# Patient Record
Sex: Male | Born: 1970
Health system: Southern US, Community
[De-identification: ages and names within clinical notes are randomized; demographics above are authoritative.]

## PROBLEM LIST (undated history)

## (undated) DIAGNOSIS — E119 Type 2 diabetes mellitus without complications: Secondary | ICD-10-CM

## (undated) DIAGNOSIS — I10 Essential (primary) hypertension: Secondary | ICD-10-CM

---

## 2017-04-19 ENCOUNTER — Inpatient Hospital Stay (HOSPITAL_COMMUNITY)
Admission: EM | Admit: 2017-04-19 | Discharge: 2017-04-21 | DRG: 638 | Disposition: A | Discharge status: Home / Self Care | Payer: Self-pay | Attending: Internal Medicine | Admitting: Internal Medicine

## 2017-04-19 ENCOUNTER — Encounter (HOSPITAL_COMMUNITY): Payer: Self-pay | Admitting: Emergency Medicine

## 2017-04-19 ENCOUNTER — Emergency Department (HOSPITAL_COMMUNITY): Payer: Self-pay

## 2017-04-19 DIAGNOSIS — R739 Hyperglycemia, unspecified: Secondary | ICD-10-CM

## 2017-04-19 DIAGNOSIS — Z87891 Personal history of nicotine dependence: Secondary | ICD-10-CM

## 2017-04-19 DIAGNOSIS — E785 Hyperlipidemia, unspecified: Secondary | ICD-10-CM | POA: Diagnosis present

## 2017-04-19 DIAGNOSIS — E860 Dehydration: Secondary | ICD-10-CM | POA: Diagnosis present

## 2017-04-19 DIAGNOSIS — Z6838 Body mass index (BMI) 38.0-38.9, adult: Secondary | ICD-10-CM

## 2017-04-19 DIAGNOSIS — E876 Hypokalemia: Secondary | ICD-10-CM | POA: Diagnosis present

## 2017-04-19 DIAGNOSIS — E1100 Type 2 diabetes mellitus with hyperosmolarity without nonketotic hyperglycemic-hyperosmolar coma (NKHHC): Principal | ICD-10-CM | POA: Diagnosis present

## 2017-04-19 DIAGNOSIS — I10 Essential (primary) hypertension: Secondary | ICD-10-CM | POA: Diagnosis present

## 2017-04-19 DIAGNOSIS — Z833 Family history of diabetes mellitus: Secondary | ICD-10-CM

## 2017-04-19 DIAGNOSIS — N179 Acute kidney failure, unspecified: Secondary | ICD-10-CM | POA: Diagnosis present

## 2017-04-19 HISTORY — DX: Essential (primary) hypertension: I10

## 2017-04-19 LAB — URINALYSIS, ROUTINE W REFLEX MICROSCOPIC
BACTERIA UA: NONE SEEN
Bilirubin Urine: NEGATIVE
Hgb urine dipstick: NEGATIVE
KETONES UR: 20 mg/dL — AB
LEUKOCYTES UA: NEGATIVE
Nitrite: NEGATIVE
PH: 6 (ref 5.0–8.0)
PROTEIN: NEGATIVE mg/dL
Specific Gravity, Urine: 1.031 — ABNORMAL HIGH (ref 1.005–1.030)
Squamous Epithelial / LPF: NONE SEEN

## 2017-04-19 LAB — BASIC METABOLIC PANEL
ANION GAP: 11 (ref 5–15)
Anion gap: 10 (ref 5–15)
Anion gap: 8 (ref 5–15)
BUN: 17 mg/dL (ref 6–20)
BUN: 14 mg/dL (ref 6–20)
BUN: 12 mg/dL (ref 6–20)
CALCIUM: 9.9 mg/dL (ref 8.9–10.3)
CHLORIDE: 106 mmol/L (ref 101–111)
CO2: 27 mmol/L (ref 22–32)
CO2: 24 mmol/L (ref 22–32)
CO2: 27 mmol/L (ref 22–32)
Calcium: 9.4 mg/dL (ref 8.9–10.3)
Calcium: 9.5 mg/dL (ref 8.9–10.3)
Chloride: 94 mmol/L — ABNORMAL LOW (ref 101–111)
Chloride: 104 mmol/L (ref 101–111)
Creatinine, Ser: 1.3 mg/dL — ABNORMAL HIGH (ref 0.61–1.24)
Creatinine, Ser: 1.15 mg/dL (ref 0.61–1.24)
Creatinine, Ser: 1.1 mg/dL (ref 0.61–1.24)
GFR calc Af Amer: >60 mL/min (ref >60)
GFR calc Af Amer: >60 mL/min (ref >60)
GLUCOSE: 766 mg/dL — AB (ref 65–99)
Glucose, Bld: 371 mg/dL — ABNORMAL HIGH (ref 65–99)
Glucose, Bld: 190 mg/dL — ABNORMAL HIGH (ref 65–99)
POTASSIUM: 4.1 mmol/L (ref 3.5–5.1)
POTASSIUM: 3.5 mmol/L (ref 3.5–5.1)
Potassium: 4.7 mmol/L (ref 3.5–5.1)
SODIUM: 139 mmol/L (ref 135–145)
SODIUM: 141 mmol/L (ref 135–145)
Sodium: 131 mmol/L — ABNORMAL LOW (ref 135–145)

## 2017-04-19 LAB — GLUCOSE, CAPILLARY
GLUCOSE-CAPILLARY: 325 mg/dL — AB (ref 65–99)
GLUCOSE-CAPILLARY: 312 mg/dL — AB (ref 65–99)
GLUCOSE-CAPILLARY: 253 mg/dL — AB (ref 65–99)
GLUCOSE-CAPILLARY: 204 mg/dL — AB (ref 65–99)
Glucose-Capillary: 383 mg/dL — ABNORMAL HIGH (ref 65–99)
Glucose-Capillary: 188 mg/dL — ABNORMAL HIGH (ref 65–99)
Glucose-Capillary: 170 mg/dL — ABNORMAL HIGH (ref 65–99)

## 2017-04-19 LAB — CBG MONITORING, ED: Glucose-Capillary: 478 mg/dL — ABNORMAL HIGH (ref 65–99)

## 2017-04-19 LAB — CBC
HCT: 44 % (ref 39.0–52.0)
HEMOGLOBIN: 15.4 g/dL (ref 13.0–17.0)
MCH: 32 pg (ref 26.0–34.0)
MCHC: 35 g/dL (ref 30.0–36.0)
MCV: 91.3 fL (ref 78.0–100.0)
Platelets: 255 10*3/uL (ref 150–400)
RBC: 4.82 MIL/uL (ref 4.22–5.81)
RDW: 12.4 % (ref 11.5–15.5)
WBC: 8.8 10*3/uL (ref 4.0–10.5)

## 2017-04-19 LAB — BLOOD GAS, VENOUS
Acid-Base Excess: 0.5 mmol/L (ref 0.0–2.0)
Bicarbonate: 25 mmol/L (ref 20.0–28.0)
O2 Saturation: 80.5 %
PCO2 VEN: 41.8 mmHg — AB (ref 44.0–60.0)
PH VEN: 7.394 (ref 7.250–7.430)
Patient temperature: 98.6
pO2, Ven: 46.1 mmHg — ABNORMAL HIGH (ref 32.0–45.0)

## 2017-04-19 LAB — I-STAT TROPONIN, ED: TROPONIN I, POC: 0 ng/mL (ref 0.00–0.08)

## 2017-04-19 LAB — MRSA PCR SCREENING: MRSA BY PCR: NEGATIVE

## 2017-04-19 LAB — OSMOLALITY: Osmolality: 316 mOsm/kg — ABNORMAL HIGH (ref 275–295)

## 2017-04-19 MED ORDER — DEXTROSE-NACL 5-0.45 % IV SOLN
INTRAVENOUS | Status: DC
  Administered 2017-04-19 – 2017-04-20 (×2): via INTRAVENOUS

## 2017-04-19 MED ORDER — SODIUM CHLORIDE 0.9 % IV SOLN
INTRAVENOUS | Status: DC
  Administered 2017-04-20: 13:00:00 via INTRAVENOUS

## 2017-04-19 MED ORDER — INSULIN REGULAR BOLUS VIA INFUSION
0.0000 [IU] | Freq: Three times a day (TID) | INTRAVENOUS | Status: DC
  Administered 2017-04-19: 10 [IU] via INTRAVENOUS
  Filled 2017-04-19: qty 10

## 2017-04-19 MED ORDER — SODIUM CHLORIDE 0.9 % IV SOLN
INTRAVENOUS | Status: DC
  Filled 2017-04-19 (×2): qty 1

## 2017-04-19 MED ORDER — ENOXAPARIN SODIUM 40 MG/0.4ML ~~LOC~~ SOLN
40.0000 mg | SUBCUTANEOUS | Status: DC
  Administered 2017-04-19: 40 mg via SUBCUTANEOUS
  Filled 2017-04-19 (×2): qty 0.4

## 2017-04-19 MED ORDER — ONDANSETRON HCL 4 MG PO TABS: 4.0000 mg | ORAL_TABLET | Freq: Four times a day (QID) | ORAL | Status: DC | PRN

## 2017-04-19 MED ORDER — SODIUM CHLORIDE 0.9 % IV SOLN
INTRAVENOUS | Status: DC
  Administered 2017-04-19: 15:00:00 via INTRAVENOUS

## 2017-04-19 MED ORDER — POTASSIUM CHLORIDE 10 MEQ/100ML IV SOLN
10.0000 meq | INTRAVENOUS | Status: DC
  Administered 2017-04-19: 10 meq via INTRAVENOUS
  Filled 2017-04-19: qty 100

## 2017-04-19 MED ORDER — HYDRALAZINE HCL 20 MG/ML IJ SOLN
10.0000 mg | Freq: Four times a day (QID) | INTRAMUSCULAR | Status: DC | PRN
  Administered 2017-04-19: 10 mg via INTRAVENOUS
  Filled 2017-04-19: qty 1

## 2017-04-19 MED ORDER — ACETAMINOPHEN 650 MG RE SUPP: 650.0000 mg | Freq: Four times a day (QID) | RECTAL | Status: DC | PRN

## 2017-04-19 MED ORDER — ONDANSETRON HCL 4 MG/2ML IJ SOLN: 4.0000 mg | Freq: Four times a day (QID) | INTRAMUSCULAR | Status: DC | PRN

## 2017-04-19 MED ORDER — SODIUM CHLORIDE 0.9 % IV SOLN
INTRAVENOUS | Status: DC
  Administered 2017-04-19: 4.2 [IU]/h via INTRAVENOUS
  Filled 2017-04-19: qty 1

## 2017-04-19 MED ORDER — SENNOSIDES-DOCUSATE SODIUM 8.6-50 MG PO TABS: 1.0000 | ORAL_TABLET | Freq: Every evening | ORAL | Status: DC | PRN

## 2017-04-19 MED ORDER — ACETAMINOPHEN 325 MG PO TABS: 650.0000 mg | ORAL_TABLET | Freq: Four times a day (QID) | ORAL | Status: DC | PRN

## 2017-04-19 MED ORDER — SODIUM CHLORIDE 0.9% FLUSH
3.0000 mL | Freq: Two times a day (BID) | INTRAVENOUS | Status: DC
  Administered 2017-04-19 – 2017-04-21 (×3): 3 mL via INTRAVENOUS

## 2017-04-19 MED ORDER — ALBUTEROL SULFATE (2.5 MG/3ML) 0.083% IN NEBU: 2.5000 mg | INHALATION_SOLUTION | RESPIRATORY_TRACT | Status: DC | PRN

## 2017-04-19 MED ORDER — AMLODIPINE BESYLATE 5 MG PO TABS
5.0000 mg | ORAL_TABLET | Freq: Every day | ORAL | Status: DC
  Administered 2017-04-19 – 2017-04-20 (×2): 5 mg via ORAL
  Filled 2017-04-19 (×2): qty 1

## 2017-04-19 MED ORDER — DEXTROSE 50 % IV SOLN: 25.0000 mL | INTRAVENOUS | Status: DC | PRN

## 2017-04-19 MED ORDER — DEXTROSE-NACL 5-0.45 % IV SOLN: INTRAVENOUS | Status: DC

## 2017-04-19 NOTE — H&P (Signed)
HISTORY AND PHYSICAL       PATIENT DETAILS Name: Albert Miller Age: 46 y.o. Sex: male Date of Birth: 1971/04/01 Admit Date: 04/19/2017 WUJ:[id number], No Pcp Per   Patient coming from: Home   CHIEF COMPLAINT:  Polyuria, polydipsia, blurry vision, dizziness for the past 3 days  HPI: Albert Miller is a 46 y.o. male with no prior significant medical history presented to the ED for evaluation of the above-noted complaints. For the past few weeks, patient has just not felt right and not his usual self. 3 days ago he started noticing significant amount of polyuria, polydipsia and blurry vision. He also started getting slightly dizzy when he ever he ambulated. He subsequently presented to the ED today, and was found to have blood sugars more than 700 and he was given IV fluids, started on IV insulin and the hospitalist service was then asked to admit this patient for further evaluation and treatment.  During my evaluation patient denied any fever, headache, chest pain, nausea, vomiting, diarrhea.  Apparently he was diagnosed with blood pressure many years ago-but he lost weight and was subsequently taken off his antihypertensives. He currently is not on any medications   ED Course:  See above  Note: Lives at: Home Mobility:  Independent Chronic Indwelling Foley:no   REVIEW OF SYSTEMS:  Constitutional:   No  weight loss, night sweats,  Fevers, chills, fatigue.  HEENT:    No headaches, Dysphagia,Tooth/dental problems,Sore throat  Cardio-vascular: No chest pain,Orthopnea, PND,lower extremity edema, anasarca, palpitations  GI:  No heartburn, indigestion, abdominal pain, nausea, vomiting, diarrhea, melena or hematochezia  Resp: No shortness of breath, cough, hemoptysis,plueritic chest pain.   Skin:  No rash or lesions.  GU:  No dysuria, change in color of urine, no urgency or frequency.  No flank pain.  Musculoskeletal: No joint pain or swelling.  No  decreased range of motion.  No back pain.  Endocrine: No heat intolerance, no cold intolerance  Psych: No change in mood or affect. No depression or anxiety.  No memory loss.   ALLERGIES:  No Known Allergies  PAST MEDICAL HISTORY: Past Medical History:  Diagnosis Date  . Hypertension     PAST SURGICAL HISTORY: History reviewed. No pertinent surgical history.  MEDICATIONS AT HOME: Prior to Admission medications   Not on File    FAMILY HISTORY: Father-diabetes mellitus   SOCIAL HISTORY:  reports that he has quit smoking. He has never used smokeless tobacco. He reports that he does not drink alcohol. His drug history is not on file.  PHYSICAL EXAM: Blood pressure (!) 180/114, pulse 99, temperature 98.2 F (36.8 C), temperature source Oral, resp. rate 16, SpO2 100 %.  General appearance :Awake, alert, not in any distress. Speech Clear. Not toxic Looking Eyes:, pupils equally reactive to light and accomodation,no scleral icterus.Pink conjunctiva HEENT: Atraumatic and Normocephalic Neck: supple, no JVD. No cervical lymphadenopathy. No thyromegaly Resp:Good air entry bilaterally, no added sounds  CVS: S1 S2 regular, no murmurs.  GI: Bowel sounds present, Non tender and not distended with no gaurding, rigidity or rebound.No organomegaly Extremities: B/L Lower Ext shows no edema, both legs are warm to touch Neurology:  speech clear,Non focal, sensation is grossly intact. Psychiatric: Normal judgment and insight. Alert and oriented x 3. Normal mood. Musculoskeletal:gait appears to be normal.No digital cyanosis Skin:No Rash, warm and dry Wounds:N/A  LABS ON ADMISSION:  I have personally reviewed following labs and imaging studies  CBC:  Recent Labs  Lab 04/19/17 1249  WBC 8.8  HGB 15.4  HCT 44.0  MCV 91.3  PLT 255    Basic Metabolic Panel:  Recent Labs Lab 04/19/17 1249  NA 131*  K 4.7  CL 94*  CO2 27  GLUCOSE 766*  BUN 17  CREATININE 1.30*  CALCIUM  9.9    GFR: CrCl cannot be calculated (Unknown ideal weight.).  Liver Function Tests: No results for input(s): AST, ALT, ALKPHOS, BILITOT, PROT, ALBUMIN in the last 168 hours. No results for input(s): LIPASE, AMYLASE in the last 168 hours. No results for input(s): AMMONIA in the last 168 hours.  Coagulation Profile: No results for input(s): INR, PROTIME in the last 168 hours.  Cardiac Enzymes: No results for input(s): CKTOTAL, CKMB, CKMBINDEX, TROPONINI in the last 168 hours.  BNP (last 3 results) No results for input(s): PROBNP in the last 8760 hours.  HbA1C: No results for input(s): HGBA1C in the last 72 hours.  CBG: No results for input(s): GLUCAP in the last 168 hours.  Lipid Profile: No results for input(s): CHOL, HDL, LDLCALC, TRIG, CHOLHDL, LDLDIRECT in the last 72 hours.  Thyroid Function Tests: No results for input(s): TSH, T4TOTAL, FREET4, T3FREE, THYROIDAB in the last 72 hours.  Anemia Panel: No results for input(s): VITAMINB12, FOLATE, FERRITIN, TIBC, IRON, RETICCTPCT in the last 72 hours.  Urine analysis:    Component Value Date/Time   COLORURINE STRAW (A) 04/19/2017 1354   APPEARANCEUR CLEAR 04/19/2017 1354   LABSPEC 1.031 (H) 04/19/2017 1354   PHURINE 6.0 04/19/2017 1354   GLUCOSEU >=500 (A) 04/19/2017 1354   HGBUR NEGATIVE 04/19/2017 1354   BILIRUBINUR NEGATIVE 04/19/2017 1354   KETONESUR 20 (A) 04/19/2017 1354   PROTEINUR NEGATIVE 04/19/2017 1354   NITRITE NEGATIVE 04/19/2017 1354   LEUKOCYTESUR NEGATIVE 04/19/2017 1354    Sepsis Labs: Lactic Acid, Venous No results found for: LATICACIDVEN   Microbiology: No results found for this or any previous visit (from the past 240 hour(s)).    RADIOLOGIC STUDIES ON ADMISSION: Dg Chest 2 View  Result Date: 04/19/2017 CLINICAL DATA:  Mid chest pain and dizziness for 3 days. Visual changes and polyuria. EXAM: CHEST  2 VIEW COMPARISON:  None. FINDINGS: Normal heart size. Lungs clear. No pneumothorax.  No pleural effusion. IMPRESSION: No active cardiopulmonary disease. Electronically Signed   By: Jolaine ClickArthur  Hoss M.D.   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 Given that he is symptomatic, he will likely require insulin on discharge. Await A1c. Begin diabetic education, consultation nutrition and diabetic coordinator.  Hypertension: Blood pressure currently uncontrolled-start low-dose amlodipine and follow.  Further plan will depend as patient's clinical course evolves and further radiologic and laboratory data become available. Patient will be monitored closely.  Above noted plan was discussed with patient/girlfriend face to face at bedside, they were in agreement.   CONSULTS: None  DVT Prophylaxis: Prophylactic Lovenox  Code Status: Full Code  Disposition Plan:  Discharge back home  possibly in 1-2 days, depending on clinical course  Admission status:  Inpatient  going to  SDU  Total time spent  55 minutes.Greater than 50% of this time was spent in counseling, explanation of diagnosis, planning of further management, and coordination of care.  Brewing technologisthanker [place] [facility] Pager [phone]  If 7PM-7AM, please contact night-coverage [email or web address] Password [email or web address] 04/19/2017, 3:31 PM

## 2017-04-19 NOTE — ED Provider Notes (Signed)
[facility]-EMERGENCY DEPT Provider Note   CSN: [id number] Arrival date & time: 04/19/17  1213     History   Chief Complaint Chief Complaint  Patient presents with  . Chest Pain    HPI Albert Miller is a 46 y.o. male with a past medical history of hypertension presenting with sudden onset of polydipsia and polyuria 3-4 days ago, dizziness and intermittent heartburn for 2 days. No known history of diabetes. Denies abdominal pain, nausea, vomiting, diarrhea, chest pressure, shortness of breath, fever, chills.  HPI  Past Medical History:  Diagnosis Date  . Hypertension     There are no active problems to display for this patient.   History reviewed. No pertinent surgical history.     Home Medications    Prior to Admission medications   Not on File    Family History History reviewed. No pertinent family history.  Social History Social History  Substance Use Topics  . Smoking status: Former Games developer  . Smokeless tobacco: Never Used  . Alcohol use No     Allergies   Patient has no known allergies.   Review of Systems Review of Systems  Constitutional: Negative for appetite change, chills and fever.  HENT: Negative for ear pain and sore throat.        Patient complaining of dry mouth  Eyes: Negative for pain and visual disturbance.  Respiratory: Negative for cough, chest tightness, shortness of breath, wheezing and stridor.   Cardiovascular: Negative for chest pain, palpitations and leg swelling.       Patient reports that he's been having a heartburn sensation on and off over the last 2 days but no chest pressure or pain  Gastrointestinal: Negative for abdominal distention, abdominal pain, diarrhea, nausea and vomiting.       He did experience some nausea 3 days ago but not at this time or since then  Endocrine: Positive for polydipsia and polyuria.  Genitourinary: Negative for dysuria and hematuria.  Musculoskeletal: Negative for arthralgias, back pain, neck  pain and neck stiffness.  Skin: Negative for color change, pallor and rash.  Neurological: Negative for seizures and syncope.     Physical Exam Updated Vital Signs BP (!) 180/114 (BP Location: Left Arm)   Pulse 99   Temp 98.2 F (36.8 C) (Oral)   Resp 16   SpO2 100%   Physical Exam  Constitutional: He is oriented to person, place, and time. He appears well-developed and well-nourished. No distress.  Patient is afebrile, nontoxic-appearing, sitting comfortably in bed in no acute distress.  HENT:  Head: Normocephalic and atraumatic.  Eyes: Conjunctivae and EOM are normal. Right eye exhibits no discharge. Left eye exhibits no discharge.  Neck: Neck supple.  Cardiovascular: Normal rate, regular rhythm and normal heart sounds.   No murmur heard. Pulmonary/Chest: Effort normal and breath sounds normal. No respiratory distress. He has no wheezes. He has no rales. He exhibits no tenderness.  Abdominal: Soft. He exhibits no distension. There is no tenderness. There is no guarding.  Musculoskeletal: He exhibits no edema or deformity.  Neurological: He is alert and oriented to person, place, and time.  Skin: Skin is warm and dry. No rash noted. He is not diaphoretic. No erythema. No pallor.  Psychiatric: He has a normal mood and affect.  Nursing note and vitals reviewed.    ED Treatments / Results  Labs (all labs ordered are listed, but only abnormal results are displayed) Labs Reviewed  BASIC METABOLIC PANEL - Abnormal; Notable for the following:  Result Value   Sodium 131 (*)    Chloride 94 (*)    Glucose, Bld 766 (*)    Creatinine, Ser 1.30 (*)    All other components within normal limits  URINALYSIS, ROUTINE W REFLEX MICROSCOPIC - Abnormal; Notable for the following:    Color, Urine STRAW (*)    Specific Gravity, Urine 1.031 (*)    Glucose, UA >=500 (*)    Ketones, ur 20 (*)    All other components within normal limits  CBC  OSMOLALITY  I-STAT TROPOININ, ED  I-STAT  VENOUS BLOOD GAS, ED    EKG  EKG Interpretation  Date/Time:  Saturday Apr 19 2017 12:23:07 EDT Ventricular Rate:  97 PR Interval:    QRS Duration: 80 QT Interval:  326 QTC Calculation: 415 R Axis:   48 Text Interpretation:  Sinus rhythm Probable anteroseptal infarct, old Borderline T abnormalities, inferior leads T wave inversion in III with flattening  in V3-V6, no previous tracing available Confirmed by Frederick PeersLittle, Rachel [phone]([id number]) on 04/19/2017 12:26:29 PM Also confirmed by Frederick PeersLittle, Rachel [phone]), editor Misty StanleyScales-Price, Shannon [phone]([id number])  on 04/19/2017 12:36:32 PM       Radiology Dg Chest 2 View  Result Date: 04/19/2017 CLINICAL DATA:  Mid chest pain and dizziness for 3 days. Visual changes and polyuria. EXAM: CHEST  2 VIEW COMPARISON:  None. FINDINGS: Normal heart size. Lungs clear. No pneumothorax. No pleural effusion. IMPRESSION: No active cardiopulmonary disease. Electronically Signed   By: Jolaine ClickArthur  Hoss M.D.   On: 04/19/2017 12:50    Procedures Procedures (including critical care time)  Medications Ordered in ED Medications  0.9 %  sodium chloride infusion ( Intravenous New Bag/Given 04/19/17 1459)  0.9 %  sodium chloride infusion ( Intravenous New Bag/Given 04/19/17 1459)  dextrose 5 %-0.45 % sodium chloride infusion (not administered)  insulin regular (NOVOLIN R,HUMULIN R) 100 Units in sodium chloride 0.9 % 100 mL (1 Units/mL) infusion (not administered)  potassium chloride 10 mEq in 100 mL IVPB (not administered)     Initial Impression / Assessment and Plan / ED Course  I have reviewed the triage vital signs and the nursing notes.  Pertinent labs & imaging results that were available during my care of the patient were reviewed by me and considered in my medical decision making (see chart for details).  Clinical Course as of Apr 19 1504  Sat Apr 19, 2017  1412 Creatinine: (!) 1.30 [JM]    Clinical Course User Index [JM] Georgiana ShoreMitchell, Sanjay Broadfoot B, [place]PA-C   Patient presents with  hyperosmolar hyperglycemic state without known history of diabetes. Pain symptoms of polyuria and polydipsia blood glucose 766. No anion gap normal pH. Started IV fluids and insulin drip. Patient otherwise stable, well-appearing. Consulted for admission  Spoke to admitting physician, Dr. Jerral RalphGhimire, who will come to see patient and admit.   Final Clinical Impressions(s) / ED Diagnoses   Final diagnoses:  Hyperglycemia    New Prescriptions New Prescriptions   No medications on file     Gregary CromerMitchell, Ayvah Caroll B, PA-C 04/19/17 1701    Gerhard MunchLockwood, Robert, MD 04/20/17 1550

## 2017-04-19 NOTE — ED Triage Notes (Signed)
Pt reports mid CP, dizziness (feels like he is walking to one side), polyuria, and intermittent vision changes for the past 3 days. Pt alert and oriented. No extremity weakness or facial droop.

## 2017-04-20 DIAGNOSIS — E1100 Type 2 diabetes mellitus with hyperosmolarity without nonketotic hyperglycemic-hyperosmolar coma (NKHHC): Principal | ICD-10-CM

## 2017-04-20 LAB — HIV ANTIBODY (ROUTINE TESTING W REFLEX): HIV Screen 4th Generation wRfx: NONREACTIVE

## 2017-04-20 LAB — BASIC METABOLIC PANEL
ANION GAP: 9 (ref 5–15)
Anion gap: 8 (ref 5–15)
BUN: 15 mg/dL (ref 6–20)
BUN: 14 mg/dL (ref 6–20)
CALCIUM: 9.1 mg/dL (ref 8.9–10.3)
CHLORIDE: 104 mmol/L (ref 101–111)
CO2: 29 mmol/L (ref 22–32)
CO2: 28 mmol/L (ref 22–32)
CREATININE: 1.3 mg/dL — AB (ref 0.61–1.24)
CREATININE: 1.15 mg/dL (ref 0.61–1.24)
Calcium: 9.1 mg/dL (ref 8.9–10.3)
Chloride: 103 mmol/L (ref 101–111)
GFR calc Af Amer: >60 mL/min (ref >60)
GFR calc Af Amer: >60 mL/min (ref >60)
GFR calc non Af Amer: >60 mL/min (ref >60)
GFR calc non Af Amer: >60 mL/min (ref >60)
GLUCOSE: 155 mg/dL — AB (ref 65–99)
GLUCOSE: 135 mg/dL — AB (ref 65–99)
Potassium: 3.4 mmol/L — ABNORMAL LOW (ref 3.5–5.1)
Potassium: 3.5 mmol/L (ref 3.5–5.1)
SODIUM: 141 mmol/L (ref 135–145)
SODIUM: 140 mmol/L (ref 135–145)

## 2017-04-20 LAB — GLUCOSE, CAPILLARY
GLUCOSE-CAPILLARY: 133 mg/dL — AB (ref 65–99)
GLUCOSE-CAPILLARY: 140 mg/dL — AB (ref 65–99)
GLUCOSE-CAPILLARY: 143 mg/dL — AB (ref 65–99)
GLUCOSE-CAPILLARY: 146 mg/dL — AB (ref 65–99)
GLUCOSE-CAPILLARY: 172 mg/dL — AB (ref 65–99)
GLUCOSE-CAPILLARY: 256 mg/dL — AB (ref 65–99)
GLUCOSE-CAPILLARY: 342 mg/dL — AB (ref 65–99)
Glucose-Capillary: 163 mg/dL — ABNORMAL HIGH (ref 65–99)
Glucose-Capillary: 172 mg/dL — ABNORMAL HIGH (ref 65–99)
Glucose-Capillary: 260 mg/dL — ABNORMAL HIGH (ref 65–99)

## 2017-04-20 LAB — CBC
HCT: 40.3 % (ref 39.0–52.0)
HEMOGLOBIN: 13.8 g/dL (ref 13.0–17.0)
MCH: 30.8 pg (ref 26.0–34.0)
MCHC: 34.2 g/dL (ref 30.0–36.0)
MCV: 90 fL (ref 78.0–100.0)
PLATELETS: 237 10*3/uL (ref 150–400)
RBC: 4.48 MIL/uL (ref 4.22–5.81)
RDW: 12.4 % (ref 11.5–15.5)
WBC: 7 10*3/uL (ref 4.0–10.5)

## 2017-04-20 LAB — LIPID PANEL
CHOLESTEROL: 179 mg/dL (ref 0–200)
HDL: 39 mg/dL — ABNORMAL LOW (ref >40)
LDL Cholesterol: 103 mg/dL — ABNORMAL HIGH (ref 0–99)
TRIGLYCERIDES: 187 mg/dL — AB (ref <150)
Total CHOL/HDL Ratio: 4.6 RATIO
VLDL: 37 mg/dL (ref 0–40)

## 2017-04-20 MED ORDER — PHENYLEPHRINE IN HARD FAT 0.25 % RE SUPP: 1.0000 | Freq: Two times a day (BID) | RECTAL | Status: DC

## 2017-04-20 MED ORDER — INSULIN ASPART 100 UNIT/ML ~~LOC~~ SOLN
0.0000 [IU] | Freq: Three times a day (TID) | SUBCUTANEOUS | Status: DC
  Administered 2017-04-20: 5 [IU] via SUBCUTANEOUS
  Administered 2017-04-20: 7 [IU] via SUBCUTANEOUS
  Administered 2017-04-20: 2 [IU] via SUBCUTANEOUS
  Administered 2017-04-21: 5 [IU] via SUBCUTANEOUS
  Administered 2017-04-21: 3 [IU] via SUBCUTANEOUS

## 2017-04-20 MED ORDER — INSULIN ASPART 100 UNIT/ML ~~LOC~~ SOLN
0.0000 [IU] | Freq: Every day | SUBCUTANEOUS | Status: DC
  Administered 2017-04-20: 3 [IU] via SUBCUTANEOUS

## 2017-04-20 MED ORDER — INSULIN DETEMIR 100 UNIT/ML ~~LOC~~ SOLN
10.0000 [IU] | Freq: Every day | SUBCUTANEOUS | Status: DC
  Administered 2017-04-20 – 2017-04-21 (×2): 10 [IU] via SUBCUTANEOUS
  Filled 2017-04-20 (×2): qty 0.1

## 2017-04-20 MED ORDER — POTASSIUM CHLORIDE CRYS ER 20 MEQ PO TBCR
20.0000 meq | EXTENDED_RELEASE_TABLET | Freq: Once | ORAL | Status: AC
  Administered 2017-04-20: 20 meq via ORAL
  Filled 2017-04-20: qty 1

## 2017-04-20 MED ORDER — HYDROCORTISONE 2.5 % RE CREA
TOPICAL_CREAM | RECTAL | Status: DC | PRN
  Filled 2017-04-20: qty 28.35

## 2017-04-20 MED ORDER — PHENYLEPHRINE IN HARD FAT 0.25 % RE SUPP
1.0000 | Freq: Every day | RECTAL | Status: DC | PRN
  Filled 2017-04-20: qty 1

## 2017-04-20 MED ORDER — SENNOSIDES-DOCUSATE SODIUM 8.6-50 MG PO TABS
1.0000 | ORAL_TABLET | Freq: Two times a day (BID) | ORAL | Status: DC
  Administered 2017-04-20 – 2017-04-21 (×2): 1 via ORAL
  Filled 2017-04-20 (×4): qty 1

## 2017-04-20 MED ORDER — LIVING WELL WITH DIABETES BOOK
Freq: Once | Status: AC
  Administered 2017-04-20: 17:00:00
  Filled 2017-04-20: qty 1

## 2017-04-20 NOTE — Progress Notes (Signed)
Patient watched the diabetic teaching video and got the book,

## 2017-04-20 NOTE — Progress Notes (Signed)
Inpatient Diabetes Program Recommendations  AACE/ADA: New Consensus Statement on Inpatient Glycemic Control (2015)  Target Ranges:  Prepandial:   less than 140 mg/dL      Peak postprandial:   less than 180 mg/dL (1-2 hours)      Critically ill patients:  140 - 180 mg/dL   Lab Results  Component Value Date   GLUCAP 256 (H) 04/20/2017    Review of Glycemic Control  Diabetes history: Newly-diagnosed Outpatient Diabetes medications: None Current orders for Inpatient glycemic control: Levemir 10 units QAM, Novolog 0-9 units tidwc and hs  HgbA1C pending. Will need affordable insulin.  Inpatient Diabetes Program Recommendations:   Begin 70/30 15 units bid. D/C Levemir. Increase Novolog to 0-15 units tidwc and hs  Needs case manager consult for assistance obtaining PCP. Will order insulin starter kit and RN to begin assisting pt to self-inject. Will need glucose meter and supplies prescription.  Diabetes Coordinator to see in am.  Thank you. Lorenda Peck, RD, LDN, CDE Inpatient Diabetes Coordinator [phone]

## 2017-04-20 NOTE — Progress Notes (Signed)
PROGRESS NOTE  Albert Miller ZOX:[id number] DOB: 1971/09/18 DOA: 04/19/2017 PCP: Patient, No Pcp Per  HPI/Recap of past 24 hours:  Feeling better, off insulin drip, mother and significant other at bedside  Assessment/Plan: Principal Problem:   Uncontrolled type 2 DM with hyperosmolar nonketotic hyperglycemia (HCC) Active Problems:   HTN (hypertension)  Uncontrolled type II DM with hyperosmolar nonketotic hyperglycemia:  New diagnosis, presented with blood sugar 766, he is started on insulin drip, ivf, admitted to stepdown.  Now off insulin drip, transfer to med tele A1c pending, consultation nutrition and diabetic coordinator.case manager consulted, patient does not have pmd, no insurance.  AKI:  Likely from dehydration ua is concentrated, no infection, does has glucose,  Cr 1.3 on admission, cr improved at 1.15 after hydration  Hypokalemia: replace k, check mag  Hypertension: Blood pressure currently uncontrolled-start low-dose amlodipine and follow.  Body mass index is 37.97 kg/m.   Further plan will depend as patient's clinical course evolves and further radiologic and laboratory data become available. Patient will be monitored closely.  Above noted plan was discussed with patient/girlfriend face to face at bedside, they were in agreement.   CONSULTS: None  DVT Prophylaxis: Prophylactic Lovenox  Code Status: Full Code  Disposition Plan:  Discharge back home  , likely on 5/21   Procedures:  none  Antibiotics:  none   Objective: BP 123/81   Pulse 68   Temp 98.3 F (36.8 C) (Oral)   Resp 18   Ht 6' (1.829 m)   Wt 129 kg (284 lb 6.3 oz)   SpO2 97%   BMI 38.57 kg/m   Intake/Output Summary (Last 24 hours) at 04/20/17 0752 Last data filed at 04/20/17 0600  Gross per 24 hour  Intake          2792.95 ml  Output              300 ml  Net          2492.95 ml   Filed Weights   04/19/17 1650 04/20/17 0501  Weight: 127 kg (279 lb 15.8  oz) 129 kg (284 lb 6.3 oz)    Exam:   General:  NAD  Cardiovascular: RRR  Respiratory: CTABL  Abdomen: Soft/ND/NT, positive BS  Musculoskeletal: No Edema  Neuro: aaox3  Data Reviewed: Basic Metabolic Panel:  Recent Labs Lab 04/19/17 1249 04/19/17 1739 04/19/17 2134 04/20/17 0002 04/20/17 0325  NA 131* 139 141 141 140  K 4.7 4.1 3.5 3.4* 3.5  CL 94* 104 106 104 103  CO2 27 24 27 29 28   GLUCOSE 766* 371* 190* 155* 135*  BUN 17 14 12 14 15   CREATININE 1.30* 1.15 1.10 1.30* 1.15  CALCIUM 9.9 9.4 9.5 9.1 9.1   Liver Function Tests: No results for input(s): AST, ALT, ALKPHOS, BILITOT, PROT, ALBUMIN in the last 168 hours. No results for input(s): LIPASE, AMYLASE in the last 168 hours. No results for input(s): AMMONIA in the last 168 hours. CBC:  Recent Labs Lab 04/19/17 1249 04/20/17 0325  WBC 8.8 7.0  HGB 15.4 13.8  HCT 44.0 40.3  MCV 91.3 90.0  PLT 255 237   Cardiac Enzymes:   No results for input(s): CKTOTAL, CKMB, CKMBINDEX, TROPONINI in the last 168 hours. BNP (last 3 results) No results for input(s): BNP in the last 8760 hours.  ProBNP (last 3 results) No results for input(s): PROBNP in the last 8760 hours.  CBG:  Recent Labs Lab 04/20/17 0227 04/20/17 8119 04/20/17 0453 04/20/17 1478  04/20/17 0745  GLUCAP 140* 133* 146* 163* 172*    Recent Results (from the past 240 hour(s))  MRSA PCR Screening     Status: None   Collection Time: 04/19/17  4:45 PM  Result Value Ref Range Status   MRSA by PCR NEGATIVE NEGATIVE Final    Comment:        The GeneXpert MRSA Assay (FDA approved for NASAL specimens only), is one component of a comprehensive MRSA colonization surveillance program. It is not intended to diagnose MRSA infection nor to guide or monitor treatment for MRSA infections.      Studies: Dg Chest 2 View  Result Date: 04/19/2017 CLINICAL DATA:  Mid chest pain and dizziness for 3 days. Visual changes and polyuria. EXAM: CHEST   2 VIEW COMPARISON:  None. FINDINGS: Normal heart size. Lungs clear. No pneumothorax. No pleural effusion. IMPRESSION: No active cardiopulmonary disease. Electronically Signed   By: Jolaine ClickArthur  Hoss M.D.   On: 04/19/2017 12:50    Scheduled Meds: . amLODipine  5 mg Oral Daily  . enoxaparin (LOVENOX) injection  40 mg Subcutaneous Q24H  . insulin aspart  0-5 Units Subcutaneous QHS  . insulin aspart  0-9 Units Subcutaneous TID WC  . insulin detemir  10 Units Subcutaneous Daily  . sodium chloride flush  3 mL Intravenous Q12H    Continuous Infusions: . sodium chloride 125 mL/hr at 04/19/17 1630  . dextrose 5 % and 0.45% NaCl 100 mL/hr at 04/20/17 [id number]     Time spent: 25mins  Eliot Popper MD, PhD  [facility] Pager [phone]. If 7PM-7AM, please contact night-coverage at [email or web address], password [email or web address] 04/20/2017, 7:52 AM  LOS: 1 day

## 2017-04-21 LAB — COMPREHENSIVE METABOLIC PANEL
ALT: 63 U/L (ref 17–63)
ANION GAP: 7 (ref 5–15)
AST: 39 U/L (ref 15–41)
Albumin: 3.4 g/dL — ABNORMAL LOW (ref 3.5–5.0)
Alkaline Phosphatase: 91 U/L (ref 38–126)
BUN: 14 mg/dL (ref 6–20)
CALCIUM: 8.7 mg/dL — AB (ref 8.9–10.3)
CO2: 26 mmol/L (ref 22–32)
Chloride: 103 mmol/L (ref 101–111)
Creatinine, Ser: 0.97 mg/dL (ref 0.61–1.24)
GFR calc non Af Amer: >60 mL/min (ref >60)
Glucose, Bld: 250 mg/dL — ABNORMAL HIGH (ref 65–99)
Potassium: 3.6 mmol/L (ref 3.5–5.1)
SODIUM: 136 mmol/L (ref 135–145)
Total Bilirubin: 0.8 mg/dL (ref 0.3–1.2)
Total Protein: 6.8 g/dL (ref 6.5–8.1)

## 2017-04-21 LAB — MAGNESIUM: MAGNESIUM: 1.7 mg/dL (ref 1.7–2.4)

## 2017-04-21 LAB — GLUCOSE, CAPILLARY
GLUCOSE-CAPILLARY: 254 mg/dL — AB (ref 65–99)
Glucose-Capillary: 230 mg/dL — ABNORMAL HIGH (ref 65–99)

## 2017-04-21 LAB — HEMOGLOBIN A1C
HEMOGLOBIN A1C: 13.1 % — AB (ref 4.8–5.6)
Mean Plasma Glucose: 329 mg/dL

## 2017-04-21 LAB — TSH: TSH: 1.17 u[IU]/mL (ref 0.350–4.500)

## 2017-04-21 MED ORDER — METFORMIN HCL 500 MG PO TABS: 500.0000 mg | ORAL_TABLET | Freq: Two times a day (BID) | ORAL | 11 refills | Status: DC

## 2017-04-21 MED ORDER — BLOOD GLUCOSE MONITORING SUPPL DEVI: 0 refills | Status: DC

## 2017-04-21 MED ORDER — GLUCOSE BLOOD VI STRP: ORAL_STRIP | 12 refills | Status: DC

## 2017-04-21 MED ORDER — ASPIRIN 81 MG PO TBEC: 81.0000 mg | DELAYED_RELEASE_TABLET | Freq: Every day | ORAL | 0 refills | Status: DC

## 2017-04-21 MED ORDER — INSULIN NPH ISOPHANE & REGULAR (70-30) 100 UNIT/ML ~~LOC~~ SUSP: 10.0000 [IU] | Freq: Two times a day (BID) | SUBCUTANEOUS | 0 refills | Status: DC

## 2017-04-21 MED ORDER — ATORVASTATIN CALCIUM 20 MG PO TABS: 20.0000 mg | ORAL_TABLET | Freq: Every day | ORAL | Status: DC

## 2017-04-21 MED ORDER — BLOOD GLUCOSE MONITOR KIT: PACK | 0 refills | Status: DC

## 2017-04-21 MED ORDER — ATORVASTATIN CALCIUM 20 MG PO TABS: 20.0000 mg | ORAL_TABLET | Freq: Every day | ORAL | 0 refills | Status: DC

## 2017-04-21 MED ORDER — TRUE METRIX METER W/DEVICE KIT: PACK | 0 refills | Status: DC

## 2017-04-21 MED ORDER — LISINOPRIL 5 MG PO TABS: 5.0000 mg | ORAL_TABLET | Freq: Every day | ORAL | 0 refills | Status: DC

## 2017-04-21 MED ORDER — METFORMIN HCL 500 MG PO TABS: 500.0000 mg | ORAL_TABLET | Freq: Two times a day (BID) | ORAL | 0 refills | Status: DC

## 2017-04-21 MED ORDER — ASPIRIN EC 81 MG PO TBEC
81.0000 mg | DELAYED_RELEASE_TABLET | Freq: Every day | ORAL | Status: DC
  Administered 2017-04-21: 81 mg via ORAL
  Filled 2017-04-21: qty 1

## 2017-04-21 MED ORDER — LISINOPRIL 5 MG PO TABS
5.0000 mg | ORAL_TABLET | Freq: Every day | ORAL | Status: DC
  Administered 2017-04-21: 5 mg via ORAL
  Filled 2017-04-21: qty 1

## 2017-04-21 MED ORDER — "INSULIN SYRINGE 29G X 1"" 0.3 ML MISC": 0 refills | Status: DC

## 2017-04-21 MED FILL — ?ATORVASTATIN 20 MG TABLET: 20 | 30 days supply | Qty: 30 | Fill #0

## 2017-04-21 MED FILL — LISINOPRIL 5 MG TAB: 5 | 30 days supply | Qty: 30 | Fill #0

## 2017-04-21 MED FILL — TRUE METRIX TEST STRIP: 25 days supply | Qty: 100 | Fill #0

## 2017-04-21 MED FILL — !TRUE METRIX BLOOD GLUCOSE: 1 days supply | Qty: 1 | Fill #0

## 2017-04-21 MED FILL — TRUEPLUS SYR 0.5ML 30GX5/16: 30G X 5/16" | 25 days supply | Qty: 100 | Fill #0

## 2017-04-21 MED FILL — !NOVOLIN 70/30 100 UNITS/ML: (70-30) 100 | 28 days supply | Qty: 10 | Fill #0

## 2017-04-21 MED FILL — TRUEplus LANCETS 28G MISC: 25 days supply | Qty: 100 | Fill #0

## 2017-04-21 MED FILL — ?METFORMIN HCL 500MG TABLET: 500 | 30 days supply | Qty: 60 | Fill #0

## 2017-04-21 NOTE — Progress Notes (Signed)
This CM met with pt at bedside for discharge planning. Pt is without PCP and insurance. This CM made pt a follow up appointment at the [facility] and placed it on his AVS. [facility] packet given to pt and resources explained to pt. MD asked for scripts for TRUE METRIX glucose meter, strips and lancets to take to [facility] for low cost supplies. Pt also given [place] coupons for medications and insulin. Pt appreciative of CM assistance. Marney Doctor RN,BSN,NCM [phone]

## 2017-04-21 NOTE — Progress Notes (Signed)
Patient able to inject insulin to himself,supervised by RN.

## 2017-04-21 NOTE — Plan of Care (Signed)
Problem: Food- and Nutrition-Related Knowledge Deficit (NB-1.1) Goal: Nutrition education Formal process to instruct or train a patient/client in a skill or to impart knowledge to help patients/clients voluntarily manage or modify food choices and eating behavior to maintain or improve health. Outcome: Completed/Met Date Met: 04/21/17  RD consulted for nutrition education regarding diabetes.   Lab Results  Component Value Date   HGBA1C 13.1 (H) 04/19/2017    RD provided "Carbohydrate Counting for People with Diabetes" handout from the Academy of Nutrition and Dietetics. Discussed different food groups and their effects on blood sugar, emphasizing carbohydrate-containing foods. Provided list of carbohydrates and recommended serving sizes of common foods.  Discussed importance of controlled and consistent carbohydrate intake throughout the day. Provided examples of ways to balance meals/snacks and encouraged intake of high-fiber, whole grain complex carbohydrates. Teach back method used.  Expect good compliance. Pt with good family support.  Body mass index is 37.97 kg/m. Pt meets criteria for obesity based on current BMI.  Current diet order is Heart Healthy/ CHO modified, patient is consuming approximately 100% of meals at this time. Labs and medications reviewed. No further nutrition interventions warranted at this time. If additional nutrition issues arise, please re-consult RD.  Clayton Bibles, MS, RD, LDN Pager: [phone] After Hours Pager: [phone]

## 2017-04-21 NOTE — Discharge Summary (Signed)
Discharge Summary  Albert Miller ZOX:[id number] DOB: [birth year]  PCP: Patient, No Pcp Per  Admit date: 04/19/2017 Discharge date: 04/21/2017  Time spent: >17mns, more than 50% time spend on coordination of care and patient's education.  Recommendations for Outpatient Follow-up:  1. F/u with PMD within a week  for hospital discharge follow up, repeat cbc/bmp at follow up  Discharge Diagnoses:  Active Hospital Problems   Diagnosis Date Noted  . 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 HTN (hypertension) 04/19/2017    Resolved Hospital Problems   Diagnosis Date Noted Date Resolved  No resolved problems to display.    Discharge Condition: stable  Diet recommendation: heart healthy/carb modified  Filed Weights   04/19/17 1650 04/20/17 0501 04/20/17 1019  Weight: 127 kg (279 lb 15.8 oz) 129 kg (284 lb 6.3 oz) 127 kg (280 lb)    History of present illness:  CHIEF COMPLAINT:  Polyuria, polydipsia, blurry vision, dizziness for the past 3 days  HPI: SRexford Prevois a 46y.o. male with no prior significant medical history presented to the ED for evaluation of the above-noted complaints. For the past few weeks, patient has just not felt right and not his usual self. 3 days ago he started noticing significant amount of polyuria, polydipsia and blurry vision. He also started getting slightly dizzy when he ever he ambulated. He subsequently presented to the ED today, and was found to have blood sugars more than 700 and he was given IV fluids, started on IV insulin and the hospitalist service was then asked to admit this patient for further evaluation and treatment.  During my evaluation patient denied any fever, headache, chest pain, nausea, vomiting, diarrhea.  Apparently he was diagnosed with blood pressure many years ago-but he lost weight and was subsequently taken off his antihypertensives. 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   transferred to med tele after off insulin drip and transition to subq insulin A1c 13%, consultation nutrition and diabetic coordinator.case manager consulted, patient does not have pmd, no insurance. He is discharge on [employer] brand insulin novolin 70/30 10unit bid, diabetes supplies prescribed  AKI:  Likely from dehydration ua is concentrated, no infection, does has glucose,  Cr 1.3 on admission, cr improved  after hydration, cr 0.97 at discharge  Hypokalemia: replace k, mag 1.7  Hypertension: new diagnosis, start lisinopril, asa  HLD; ldl 103, hdl 39, start lipitor 29mdaily  Body mass index is 37.97 kg/m.    Above noted plan was discussed with patient/girlfriend/mother face to face at bedside, they were in agreement.   CONSULTS: Diabetes RN Case manager  DVT Prophylaxis while in the hospital: Prophylactic Lovenox  Code Status: Full Code  Disposition Plan:Discharge back home  on 5/21   Procedures:  none  Antibiotics:  none   Discharge Exam: BP (!) 142/88 (BP Location: Right Arm)   Pulse 77   Temp 98.3 F (36.8 C) (Oral)   Resp 16   Ht 6' (1.829 m)   Wt 127 kg (280 lb)   SpO2 100%   BMI 37.97 kg/m   General: NAD Cardiovascular: RRR Respiratory: CTABL  Discharge Instructions You were cared for by a hospitalist  during your hospital stay. If you have any questions about your discharge medications or the care you received while you were in the hospital after you are discharged, you can call the unit and asked to speak with the hospitalist on call if the hospitalist that took care of you is not  available. Once you are discharged, your primary care physician will handle any further medical issues. Please note that NO REFILLS for any discharge medications will be authorized once you are discharged, as it is imperative that you return to your primary care physician (or establish a relationship with a primary care physician if you do not have one) for your aftercare needs so that they can reassess your need for medications and monitor your lab values.  Discharge Instructions    Ambulatory referral to Nutrition and Diabetic Education    Complete by:  As directed    Diet - low sodium heart healthy    Complete by:  As directed    Carb modified   Increase activity slowly    Complete by:  As directed      Allergies as of 04/21/2017   No Known Allergies     Medication List    TAKE these medications   aspirin 81 MG EC tablet Take 1 tablet (81 mg total) by mouth daily.   atorvastatin 20 MG tablet Commonly known as:  LIPITOR Take 1 tablet (20 mg total) by mouth daily at 6 PM.   blood glucose meter kit and supplies Kit Dispense based on patient and insurance preference. Use up to four times daily as directed. (FOR ICD-9 250.00, 250.01).   insulin NPH-regular Human (70-30) 100 UNIT/ML injection Commonly known as:  NOVOLIN 70/30 Inject 10 Units into the skin 2 (two) times daily with a meal.   INSULIN SYRINGE .3CC/29GX1" 29G X 1" 0.3 ML Misc Insulin injection bid, for a month supply   lisinopril 5 MG tablet Commonly known as:  PRINIVIL,ZESTRIL Take 1 tablet (5 mg total) by mouth daily.   metFORMIN 500 MG tablet Commonly known as:  GLUCOPHAGE Take 1 tablet (500 mg total) by mouth 2 (two) times daily with a meal.      No Known Allergies Follow-up Information    [place] Follow up in 1 week(s).   Why:  hospital discharge follow up, continue to work with your primary care doctor for blood sugar control and insulin dose adjustment. Contact  information: [address] [place] [address] [phone]           The results of significant diagnostics from this hospitalization (including imaging, microbiology, ancillary and laboratory) are listed below for reference.    Significant Diagnostic Studies: Dg Chest 2 View  Result Date: 04/19/2017 CLINICAL DATA:  Mid chest pain and dizziness for 3 days. Visual changes and polyuria. EXAM: CHEST  2 VIEW COMPARISON:  None. FINDINGS: Normal heart size. Lungs clear. No pneumothorax. No pleural effusion. IMPRESSION: No active cardiopulmonary disease. Electronically Signed   By: Marybelle Killings M.D.   On: 04/19/2017 12:50    Microbiology: Recent Results (from the past 240 hour(s))  MRSA PCR Screening     Status: None   Collection Time: 04/19/17  4:45 PM  Result Value Ref Range Status   MRSA by PCR NEGATIVE NEGATIVE Final    Comment:        The GeneXpert MRSA Assay (FDA approved for NASAL specimens only), is one component of a comprehensive MRSA colonization surveillance program.  It is not intended to diagnose MRSA infection nor to guide or monitor treatment for MRSA infections.      Labs: Basic Metabolic Panel:  Recent Labs Lab 04/19/17 1739 04/19/17 2134 04/20/17 0002 04/20/17 0325 04/21/17 0356  NA 139 141 141 140 136  K 4.1 3.5 3.4* 3.5 3.6  CL 104 106 104 103 103  CO2 '24 27 29 28 26  ' GLUCOSE 371* 190* 155* 135* 250*  BUN '14 12 14 15 14  ' CREATININE 1.15 1.10 1.30* 1.15 0.97  CALCIUM 9.4 9.5 9.1 9.1 8.7*  MG  --   --   --   --  1.7   Liver Function Tests:  Recent Labs Lab 04/21/17 0356  AST 39  ALT 63  ALKPHOS 91  BILITOT 0.8  PROT 6.8  ALBUMIN 3.4*   No results for input(s): LIPASE, AMYLASE in the last 168 hours. No results for input(s): AMMONIA in the last 168 hours. CBC:  Recent Labs Lab 04/19/17 1249 04/20/17 0325  WBC 8.8 7.0  HGB 15.4 13.8  HCT 44.0 40.3  MCV 91.3 90.0  PLT 255 237   Cardiac Enzymes: No  results for input(s): CKTOTAL, CKMB, CKMBINDEX, TROPONINI in the last 168 hours. BNP: BNP (last 3 results) No results for input(s): BNP in the last 8760 hours.  ProBNP (last 3 results) No results for input(s): PROBNP in the last 8760 hours.  CBG:  Recent Labs Lab 04/20/17 0745 04/20/17 1223 04/20/17 1645 04/20/17 2130 04/21/17 0747  GLUCAP 172* 342* 260* 256* 230*       SignedFlorencia Reasons MD, PhD  [facility] 04/21/2017, 8:59 AM

## 2017-04-21 NOTE — Plan of Care (Signed)
Problem: Education: Goal: Ability to describe self-care measures that may prevent or decrease complications (Diabetes Survival Skills Education) will improve Outcome: Completed/Met Date Met: 04/21/17 Discussed DM videos Booklet received and reviewed by patient and wife Patient educated on importance of monitoring glucose levels Nutrition discussed in depth by RD, Discussed carb amount for meal and snacks with patient and wife when asked Discussed profile of insulin prescribed and when to take Reviewed s/s and treatment for both hyper and hypoglycemia Reviewed sick day guidelines in relation to insulin frequent glucose checks and possible oral medications When to call 911  Problem: Health Behavior: Goal: Ability to manage health-related needs will improve Outcome: Completed/Met Date Met: 04/21/17 Will Follow up with PCP Wednesday 5/23  Problem: Physical Regulation: Goal: Diagnostic test results will improve Outcome: Completed/Met Date Met: 04/21/17 Patient informed of A1c level of 13.1%

## 2017-04-21 NOTE — Progress Notes (Signed)
Inpatient Diabetes Program Recommendations  AACE/ADA: New Consensus Statement on Inpatient Glycemic Control (2015)  Target Ranges:  Prepandial:   less than 140 mg/dL      Peak postprandial:   less than 180 mg/dL (1-2 hours)      Critically ill patients:  140 - 180 mg/dL   Spoke with patient about new diabetes diagnosis.  Discussed A1C results (13.1%) and explained what an A1C is. Discussed basic pathophysiology of DM Type 2, basic home care, importance of checking CBGs and maintaining good CBG control to prevent long-term and short-term complications. Reviewed glucose and A1C goals.  Reviewed signs and symptoms of hyperglycemia and hypoglycemia along with treatment for both. Discussed impact of nutrition, exercise, stress, sickness, and medications on diabetes control. Reviewed Living Well with diabetes booklet. Informed patient that he will be prescribed Novolin 70/30 since it is more affordable. Informed patient that Novolin 70/30 [facility] $25 per vial. Provided patient with handout information on [employer] products. Verified with Arna Mediciora, Case Manager, that patient will [facility] and will follow up there on Wednesday 5/23.  Discussed 70/30 insulin in detail (how to take it, when to take it) and instructed patient he would begin taking it today with supper. Asked patient to check his glucose 3-4 times per day (before meals and at bedtime) and to keep a log book of glucose readings and insulin taken. Explained how the doctor he follows up with can use the log book to continue to make insulin adjustments if needed. Patient verbalized understanding of information discussed and he states that he has no further questions at this time related to diabetes.   RNs have provided ongoing basic DM education at bedside with this patient and engaged patient to actively check blood glucose and administer insulin injections.   Thanks, Christena DeemShannon Kanan Sobek RN, MSN,  [facility] Inpatient Diabetes Coordinator Team Pager [phone] (8a-5p)

## 2017-04-23 ENCOUNTER — Encounter: Payer: Self-pay | Admitting: Physician Assistant

## 2017-04-23 ENCOUNTER — Ambulatory Visit: Payer: Self-pay | Attending: Internal Medicine | Admitting: Physician Assistant

## 2017-04-23 VITALS — BP 135/76 | HR 79 | Temp 98.4°F | Wt 292.2 lb

## 2017-04-23 DIAGNOSIS — Z794 Long term (current) use of insulin: Secondary | ICD-10-CM | POA: Insufficient documentation

## 2017-04-23 DIAGNOSIS — E1100 Type 2 diabetes mellitus with hyperosmolarity without nonketotic hyperglycemic-hyperosmolar coma (NKHHC): Secondary | ICD-10-CM | POA: Insufficient documentation

## 2017-04-23 DIAGNOSIS — Z7982 Long term (current) use of aspirin: Secondary | ICD-10-CM | POA: Insufficient documentation

## 2017-04-23 DIAGNOSIS — N179 Acute kidney failure, unspecified: Secondary | ICD-10-CM | POA: Insufficient documentation

## 2017-04-23 DIAGNOSIS — E781 Pure hyperglyceridemia: Secondary | ICD-10-CM | POA: Insufficient documentation

## 2017-04-23 DIAGNOSIS — I10 Essential (primary) hypertension: Secondary | ICD-10-CM | POA: Insufficient documentation

## 2017-04-23 DIAGNOSIS — E876 Hypokalemia: Secondary | ICD-10-CM | POA: Insufficient documentation

## 2017-04-23 LAB — GLUCOSE, POCT (MANUAL RESULT ENTRY): POC GLUCOSE: 224 mg/dL — AB (ref 70–99)

## 2017-04-23 MED ORDER — ASPIRIN 81 MG PO TBEC: 81.0000 mg | DELAYED_RELEASE_TABLET | Freq: Every day | ORAL | 1 refills | Status: DC

## 2017-04-23 MED ORDER — METFORMIN HCL 1000 MG PO TABS: 1000.0000 mg | ORAL_TABLET | Freq: Two times a day (BID) | ORAL | 3 refills | Status: DC

## 2017-04-23 MED ORDER — ATORVASTATIN CALCIUM 20 MG PO TABS: 20.0000 mg | ORAL_TABLET | Freq: Every day | ORAL | 1 refills | Status: DC

## 2017-04-23 MED ORDER — LISINOPRIL 5 MG PO TABS: 5.0000 mg | ORAL_TABLET | Freq: Every day | ORAL | 1 refills | Status: DC

## 2017-04-23 MED ORDER — INSULIN NPH ISOPHANE & REGULAR (70-30) 100 UNIT/ML ~~LOC~~ SUSP: 10.0000 [IU] | Freq: Two times a day (BID) | SUBCUTANEOUS | 0 refills | Status: DC

## 2017-04-23 NOTE — Progress Notes (Signed)
Patient ID: Albert Miller, male   DOB: Feb 02, 1971, 46 y.o.   MRN: [id number]   Albert Miller, is a 46 y.o. male  WUJ:[id number]  NFA:[id number]  DOB - 06-16-1971  Subjective:  Chief Complaint and HPI: Albert Miller is a 46 y.o. male here today to establish care and for a follow up visit After being hospitalized 04/19/2017-04/21/2017 after presenting to the ED with polyuria, polydipsia, blurry vision X 3 days.  Blood sugar was >700.  Those symptoms are resolved now.  He is doing well today.  He obtained his glucometer and all of his medications.  Blood sugars running from 178-260 since leaving the hospital.  He is making aggressive lifestyle changes.  He doesn't smoke or drink alcohol.  He does not have any complaints today  Hospital Course:  Principal Problem:   Uncontrolled type 2 DM with hyperosmolar nonketotic hyperglycemia (HCC) Active Problems:   HTN (hypertension)  Uncontrolled type II DM with hyperosmolar nonketotic hyperglycemia: New diagnosis, presented with blood sugar 766, he is started on insulin drip, ivf, admitted to stepdown.   transferred to med tele after off insulin drip and transition to subq insulin A1c 13%, consultation nutrition and diabetic coordinator.case manager consulted, patient does not have pmd, no insurance. He is discharge on [employer] brand insulin novolin 70/30 10unit bid, diabetes supplies prescribed  AKI:  Likely from dehydration ua is concentrated, no infection, does has glucose,  Cr 1.3 on admission, cr improved  after hydration, cr 0.97 at discharge  Hypokalemia: replace k, mag 1.7  Hypertension: new diagnosis, start lisinopril, asa  HLD; ldl 103, hdl 39, start lipitor 64m daily  Social:married, works at [employer] non-smoker, no alcohol. ED/Hospital notes reviewed.    ROS:   Constitutional:  No f/c, No night sweats, No unexplained weight loss. EENT:  No vision changes, No blurry vision, No hearing changes. No mouth,  throat, or ear problems.  Respiratory: No cough, No SOB Cardiac: No CP, no palpitations GI:  No abd pain, No N/V/D. GU: No Urinary s/sx Musculoskeletal: No joint pain Neuro: No headache, no dizziness, no motor weakness.  Skin: No rash Endocrine:  No polydipsia. No polyuria.  Psych: Denies SI/HI  Problem  High Triglycerides    ALLERGIES: No Known Allergies  PAST MEDICAL HISTORY: Past Medical History:  Diagnosis Date  . Hypertension     MEDICATIONS AT HOME: Prior to Admission medications   Medication Sig Start Date End Date Taking? Authorizing Provider  aspirin 81 MG EC tablet Take 1 tablet (81 mg total) by mouth daily. 04/23/17   MArgentina Donovan PA-C  atorvastatin (LIPITOR) 20 MG tablet Take 1 tablet (20 mg total) by mouth daily at 6 PM. 04/23/17   [name], ADionne Bucy PA-C  blood glucose meter kit and supplies KIT Dispense based on patient and insurance preference. Use up to four times daily as directed. (FOR ICD-9 250.00, 250.01). 04/21/17   XFlorencia Reasons MD  Blood Glucose Monitoring Suppl (TRUE METRIX METER) w/Device KIT one 04/21/17   XFlorencia Reasons MD  Blood Glucose Monitoring Suppl DEVI True metrix, lancet 04/21/17   XFlorencia Reasons MD  glucose blood (TRUE METRIX BLOOD GLUCOSE TEST) test strip Use as instructed 04/21/17   XFlorencia Reasons MD  insulin NPH-regular Human (NOVOLIN 70/30) (70-30) 100 UNIT/ML injection Inject 10 Units into the skin 2 (two) times daily with a meal. 04/23/17   [name], ADionne Bucy PA-C  Insulin Syringe-Needle U-100 (INSULIN SYRINGE .3CC/29GX1") 29G X 1" 0.3 ML MISC Insulin injection bid, for a month  supply 04/21/17   [name], MD  lisinopril (PRINIVIL,ZESTRIL) 5 MG tablet Take 1 tablet (5 mg total) by mouth daily. 04/23/17   [name], PA-C  metFORMIN (GLUCOPHAGE) 1000 MG tablet Take 1 tablet (1,000 mg total) by mouth 2 (two) times daily with a meal. 04/23/17   [name], PA-C     Objective:  EXAM:   Vitals:   04/23/17 0953  BP: 135/76  Pulse: 79  Temp:  98.4 F (36.9 C)  TempSrc: Oral  SpO2: 95%  Weight: 292 lb 3.2 oz (132.5 kg)    General appearance : A&OX3. NAD. Non-toxic-appearing, obese HEENT: Atraumatic and Normocephalic.  PERRLA. EOM intact.  Neck: supple, no JVD. No cervical lymphadenopathy. No thyromegaly Chest/Lungs:  Breathing-non-labored, Good air entry bilaterally, breath sounds normal without rales, rhonchi, or wheezing  CVS: S1 S2 regular, no murmurs, gallops, rubs  Extremities: Bilateral Lower Ext shows no edema, both legs are warm to touch with = pulse throughout Neurology:  CN II-XII grossly intact, Non focal.   Psych:  TP linear. J/I WNL. Normal speech. Appropriate eye contact and affect.  Skin:  No Rash  Data Review Lab Results  Component Value Date   HGBA1C 13.1 (H) 04/19/2017     Assessment & Plan   1. 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  Improving control - Glucose (CBG) Tolerating 563m bid, will titrate up- metFORMIN (GLUCOPHAGE) 1000 MG tablet; Take 1 tablet (1,000 mg total) by mouth 2 (two) times daily with a meal.  Dispense: 180 tablet; Refill: 3 - aspirin 81 MG EC tablet; Take 1 tablet (81 mg total) by mouth daily.  Dispense: 90 tablet; Refill: 1 - insulin NPH-regular Human (NOVOLIN 70/30) (70-30) 100 UNIT/ML injection; Inject 10 Units into the skin 2 (two) times daily with a meal.  Dispense: 10 mL; Refill: 0  2. Essential hypertension Adequate control Continue Lisinopril 579mand check BP 3-5 times/week and record and bring to next visit. - lisinopril (PRINIVIL,ZESTRIL) 5 MG tablet; Take 1 tablet (5 mg total) by mouth daily.  Dispense: 90 tablet; Refill: 1  3. High triglycerides continue - atorvastatin (LIPITOR) 20 MG tablet; Take 1 tablet (20 mg total) by mouth daily at 6 PM.  Dispense: 90 tablet; Refill: 1  I discussed discussed diabetic diet at length, answered all questions by patient and his wife, spent more than  30 mins face to face with patient in addition to reviewing hospital records and labs.  Patient have been counseled extensively about nutrition and exercise  Return in about 3 weeks (around 05/14/2017) for assign PCP; f/up DM/htn-nelwy diagnosed.  The patient was given clear instructions to go to ER or return to medical center if symptoms don't improve, worsen or new problems develop. The patient verbalized understanding. The patient was told to call to get lab results if they haven't heard anything in the next week.     AnFreeman CaldronPA-C [facility] 04/23/2017, 10:20 AM

## 2017-04-23 NOTE — Patient Instructions (Signed)
Drink at least 100 ounces water daily.    Check blood sugars fasting and at bedtime and bring to next visit.   Check Blood pressure 3 to 5 times weekly and record and bring to next visit.  Continue Metformin 500mg  twice daily through the weekend, then increase to 2 tabs twice daily.  Your next prescription has already been sent to the pharmacy at the higher dose.

## 2017-05-09 MED FILL — LISINOPRIL 5 MG TAB: 5 | 30 days supply | Qty: 30 | Fill #0

## 2017-05-09 MED FILL — ATORVASTATIN 20 MG TABLET: 20 | 30 days supply | Qty: 30 | Fill #0

## 2017-05-09 MED FILL — ?METFORMIN HCL 1,000 MG TAB: 1000 | 30 days supply | Qty: 60 | Fill #0

## 2017-05-15 ENCOUNTER — Encounter: Payer: Self-pay | Admitting: Internal Medicine

## 2017-05-15 ENCOUNTER — Ambulatory Visit: Payer: Self-pay | Attending: Internal Medicine | Admitting: Internal Medicine

## 2017-05-15 VITALS — BP 157/91 | HR 66 | Temp 98.9°F | Resp 16 | Wt 287.8 lb

## 2017-05-15 DIAGNOSIS — E119 Type 2 diabetes mellitus without complications: Secondary | ICD-10-CM

## 2017-05-15 DIAGNOSIS — Z87891 Personal history of nicotine dependence: Secondary | ICD-10-CM | POA: Insufficient documentation

## 2017-05-15 DIAGNOSIS — Z79899 Other long term (current) drug therapy: Secondary | ICD-10-CM | POA: Insufficient documentation

## 2017-05-15 DIAGNOSIS — Z7984 Long term (current) use of oral hypoglycemic drugs: Secondary | ICD-10-CM | POA: Insufficient documentation

## 2017-05-15 DIAGNOSIS — E1100 Type 2 diabetes mellitus with hyperosmolarity without nonketotic hyperglycemic-hyperosmolar coma (NKHHC): Secondary | ICD-10-CM | POA: Insufficient documentation

## 2017-05-15 DIAGNOSIS — Z7982 Long term (current) use of aspirin: Secondary | ICD-10-CM | POA: Insufficient documentation

## 2017-05-15 DIAGNOSIS — I10 Essential (primary) hypertension: Secondary | ICD-10-CM | POA: Insufficient documentation

## 2017-05-15 DIAGNOSIS — B351 Tinea unguium: Secondary | ICD-10-CM | POA: Insufficient documentation

## 2017-05-15 DIAGNOSIS — E785 Hyperlipidemia, unspecified: Secondary | ICD-10-CM | POA: Insufficient documentation

## 2017-05-15 LAB — GLUCOSE, POCT (MANUAL RESULT ENTRY): POC GLUCOSE: 141 mg/dL — AB (ref 70–99)

## 2017-05-15 MED ORDER — LISINOPRIL 10 MG PO TABS: 10.0000 mg | ORAL_TABLET | Freq: Every day | ORAL | 1 refills | Status: DC

## 2017-05-15 NOTE — Patient Instructions (Signed)
  Your blood pressure is not well controlled. We have increased his lisinopril from 5 mg to 10 mg daily. Continue to limit salt in the foods. Check your blood pressure 2-3 times a week at the gym before exercise.  Continue metformin 500 mg twice a day. Hold off on taking the insulin unless your blood sugars started to increase again. Continue healthy eating and regular exercise as you have been doing.  I have referred to to the podiatrist.

## 2017-05-15 NOTE — Progress Notes (Addendum)
Patient ID: Albert Miller, male    DOB: 1971/08/15  MRN: [id number]  CC: Establish Care; Hypertension; and Diabetes   Subjective: Albert Miller is a 46 y.o. male who presents for chronic ds management. His concerns today include:  Pt with hx of DM, HTN and HL. Patient was last seen on 5/23 by our PA post hospitalization with new diagnosis of diabetes. At that time he was on Novolin 7030 insulin 10 units twice a day and metformin was increased to 1 g twice a day  1. DM: -Metformin increased to 1 gram BID on last visit but he stayed on 500 mg BID because BS have been better. He also stopped insulin since 04/23/2017.  Also had some lows in 60s -Brings log: checking 2-3 x a day.  Range 94-139. -doing a lot better with eating habits.  "I changed my diet completely -exercising 3-4 x a wk.  Going to gym -vision not as blurry as before.  Had eye exam 2 wks ago by optometrist -Checks his feet regularly. Toenails on the big toes are broken off. He was wondering if there is anything that can be done with them.  2.  HTN:  reportrs home BP readings were in 120-130s.  -limits salt in foods -compliant with Lisinopril and ASA -Denies chest pains, shortness of breath, headaches, dizziness  3. HL:  Tolerating Lipitor without muscle aches or cramps    Patient Active Problem List   Diagnosis Date Noted  . High triglycerides 04/23/2017  . Uncontrolled type 2 DM with hyperosmolar nonketotic hyperglycemia ([place]) 04/19/2017  . HTN (hypertension) 04/19/2017     Current Outpatient Prescriptions on File Prior to Visit  Medication Sig Dispense Refill  . aspirin 81 MG EC tablet Take 1 tablet (81 mg total) by mouth daily. 90 tablet 1  . atorvastatin (LIPITOR) 20 MG tablet Take 1 tablet (20 mg total) by mouth daily at 6 PM. 90 tablet 1  . blood glucose meter kit and supplies KIT Dispense based on patient and insurance preference. Use up to four times daily as directed. (FOR ICD-9 250.00,  250.01). 1 each 0  . Blood Glucose Monitoring Suppl (TRUE METRIX METER) w/Device KIT one 1 kit 0  . Blood Glucose Monitoring Suppl DEVI True metrix, lancet 100 each 0  . glucose blood (TRUE METRIX BLOOD GLUCOSE TEST) test strip Use as instructed 100 each 12  . metFORMIN (GLUCOPHAGE) 1000 MG tablet Take 1 tablet (1,000 mg total) by mouth 2 (two) times daily with a meal. 180 tablet 3   No current facility-administered medications on file prior to visit.     No Known Allergies  Social History   Social History  . Marital status: Single    Spouse name: N/A  . Number of children: N/A  . Years of education: N/A   Occupational History  . Not on file.   Social History Main Topics  . Smoking status: Former Research scientist (life sciences)  . Smokeless tobacco: Never Used  . Alcohol use No  . Drug use: No  . Sexual activity: Not Currently   Other Topics Concern  . Not on file   Social History Narrative  . No narrative on file    No family history on file.  No past surgical history on file.  ROS: Review of Systems  Constitutional: Negative for fever.  Respiratory: Negative for cough and shortness of breath.   Cardiovascular: Negative for chest pain, palpitations and leg swelling.  Genitourinary: Negative for dysuria.   Marland Kitchen  PHYSICAL EXAM: BP (!) 157/91   Pulse 66   Temp 98.9 F (37.2 C) (Oral)   Resp 16   Wt 287 lb 12.8 oz (130.5 kg)   SpO2 97%   BMI 39.03 kg/m   Repeat BP 144/100 Physical Exam General appearance - alert, well appearing, middle-age African-American male and in no distress Mental status - alert, oriented to person, place, and time, normal mood, behavior, speech, dress, motor activity, and thought processes Mouth - mucous membranes moist, pharynx normal without lesions Neck - supple, no significant adenopathy Chest - clear to auscultation, no wheezes, rales or rhonchi, symmetric air entry Heart - normal rate, regular rhythm, normal S1, S2, no murmurs, rubs, clicks or  gallops Extremities - peripheral pulses normal, no pedal edema, no clubbing or cyanosis Nails: toenails a little over grown except for the nails on big toes which are dystrophic and slightly raised off nail bed. Diabetic Foot Exam - Simple   Simple Foot Form Visual Inspection See comments:  Yes Sensation Testing Intact to touch and monofilament testing bilaterally:  Yes Pulse Check Posterior Tibialis and Dorsalis pulse intact bilaterally:  Yes Comments Some peeling of skin around medial heels     Results for orders placed or performed in visit on 05/15/17  POCT glucose (manual entry)  Result Value Ref Range   POC Glucose 141 (A) 70 - 99 mg/dl    ASSESSMENT AND PLAN: 1. Controlled type 2 diabetes mellitus without complication, without long-term current use of insulin ([place]) -Patient's blood sugars have come down nicely and appears to be doing well without insulin and on the lower dose of metformin. He will continue to hold insulin for now unless blood sugars started to increase again. I have encouraged him to continue healthy eating habits and regular exercise as he has been doing. -We will know for symptoms of hypoglycemia and how to treat. - POCT glucose (manual entry)  2. Essential hypertension Not at goal. Increase lisinopril. Patient to check blood pressure at the gym where there is an arm cuff and bring in recordings on next visit Continue low-salt diet - lisinopril (PRINIVIL,ZESTRIL) 10 MG tablet; Take 1 tablet (10 mg total) by mouth daily.  Dispense: 90 tablet; Refill: 1  3. Hyperlipidemia, unspecified hyperlipidemia type Continue Lipitor  4. Onychomycosis due to dermatophyte -Nails on the big toes fair to far gone to benefit from oral medication. He may have to have the toenails removed. - Ambulatory referral to Podiatry   Patient was given the opportunity to ask questions.  Patient verbalized understanding of the plan and was able to repeat key elements of the plan.    Orders Placed This Encounter  Procedures  . Ambulatory referral to Podiatry  . POCT glucose (manual entry)     Requested Prescriptions   Signed Prescriptions Disp Refills  . lisinopril (PRINIVIL,ZESTRIL) 10 MG tablet 90 tablet 1    Sig: Take 1 tablet (10 mg total) by mouth daily.    Return in about 2 months (around 07/15/2017).  Karle Plumber, MD, FACP

## 2017-06-06 MED FILL — TRUE METRIX TEST STRIP: 30 days supply | Qty: 50 | Fill #0

## 2017-07-11 MED FILL — TRUE METRIX TEST STRIP: 30 days supply | Qty: 50 | Fill #1

## 2017-07-22 ENCOUNTER — Ambulatory Visit: Payer: Self-pay | Admitting: Internal Medicine

## 2017-11-07 ENCOUNTER — Other Ambulatory Visit: Payer: Self-pay | Admitting: *Deleted

## 2017-11-07 MED ORDER — GLUCOSE BLOOD VI STRP: ORAL_STRIP | 12 refills | Status: DC

## 2017-11-07 MED FILL — TRUE METRIX TEST STRIP: 30 days supply | Qty: 100 | Fill #0

## 2017-11-07 MED FILL — ?METFORMIN HCL 1,000 MG TAB: 1000 | 30 days supply | Qty: 60 | Fill #1

## 2017-11-07 NOTE — Telephone Encounter (Signed)
[facility] of [facility]

## 2018-03-10 MED FILL — metFORMIN HCL 1000 MG TABS: 1000 | 30 days supply | Qty: 60 | Fill #2

## 2018-06-16 ENCOUNTER — Ambulatory Visit: Payer: Managed Care, Other (non HMO) | Attending: Internal Medicine | Admitting: Internal Medicine

## 2018-06-16 ENCOUNTER — Encounter: Payer: Self-pay | Admitting: Internal Medicine

## 2018-06-16 VITALS — BP 173/110 | HR 55 | Temp 98.3°F | Resp 16 | Ht 72.0 in | Wt 294.8 lb

## 2018-06-16 DIAGNOSIS — M25572 Pain in left ankle and joints of left foot: Secondary | ICD-10-CM | POA: Diagnosis not present

## 2018-06-16 DIAGNOSIS — E1100 Type 2 diabetes mellitus with hyperosmolarity without nonketotic hyperglycemic-hyperosmolar coma (NKHHC): Secondary | ICD-10-CM | POA: Diagnosis not present

## 2018-06-16 DIAGNOSIS — M222X1 Patellofemoral disorders, right knee: Secondary | ICD-10-CM

## 2018-06-16 DIAGNOSIS — Z87891 Personal history of nicotine dependence: Secondary | ICD-10-CM | POA: Insufficient documentation

## 2018-06-16 DIAGNOSIS — M25561 Pain in right knee: Secondary | ICD-10-CM | POA: Insufficient documentation

## 2018-06-16 DIAGNOSIS — E119 Type 2 diabetes mellitus without complications: Secondary | ICD-10-CM | POA: Diagnosis present

## 2018-06-16 DIAGNOSIS — Z7982 Long term (current) use of aspirin: Secondary | ICD-10-CM | POA: Diagnosis not present

## 2018-06-16 DIAGNOSIS — E781 Pure hyperglyceridemia: Secondary | ICD-10-CM | POA: Diagnosis not present

## 2018-06-16 DIAGNOSIS — M25562 Pain in left knee: Secondary | ICD-10-CM | POA: Diagnosis not present

## 2018-06-16 DIAGNOSIS — I10 Essential (primary) hypertension: Secondary | ICD-10-CM | POA: Diagnosis not present

## 2018-06-16 DIAGNOSIS — Z7984 Long term (current) use of oral hypoglycemic drugs: Secondary | ICD-10-CM | POA: Insufficient documentation

## 2018-06-16 DIAGNOSIS — E785 Hyperlipidemia, unspecified: Secondary | ICD-10-CM | POA: Diagnosis not present

## 2018-06-16 DIAGNOSIS — M222X2 Patellofemoral disorders, left knee: Secondary | ICD-10-CM

## 2018-06-16 LAB — GLUCOSE, POCT (MANUAL RESULT ENTRY): POC GLUCOSE: 136 mg/dL — AB (ref 70–99)

## 2018-06-16 MED ORDER — DICLOFENAC SODIUM 1 % TD GEL: 2.0000 g | Freq: Four times a day (QID) | TRANSDERMAL | 1 refills | Status: DC

## 2018-06-16 MED ORDER — ATORVASTATIN CALCIUM 20 MG PO TABS: 20.0000 mg | ORAL_TABLET | Freq: Every day | ORAL | 1 refills | Status: DC

## 2018-06-16 MED ORDER — METFORMIN HCL 500 MG PO TABS: 500.0000 mg | ORAL_TABLET | Freq: Every day | ORAL | 1 refills | Status: DC

## 2018-06-16 MED ORDER — LISINOPRIL 10 MG PO TABS: 10.0000 mg | ORAL_TABLET | Freq: Every day | ORAL | 1 refills | Status: DC

## 2018-06-16 MED FILL — ATORVASTATIN 20 MG TABLET: 20 | 30 days supply | Qty: 30 | Fill #0

## 2018-06-16 MED FILL — metFORMIN HCL 500 MG TABS: 500 | 30 days supply | Qty: 30 | Fill #0

## 2018-06-16 MED FILL — LISINOPRIL 10 MG TABS: 10 | 30 days supply | Qty: 30 | Fill #0

## 2018-06-16 NOTE — Patient Instructions (Addendum)
Follow up in 3 weeks with clinical pharmacist for blood pressure recheck.   Follow a Healthy Eating Plan - You can do it! Limit sugary drinks.  Avoid sodas, sweet tea, sport or energy drinks, or fruit drinks.  Drink water, lo-fat milk, or diet drinks. Limit snack foods.   Cut back on candy, cake, cookies, chips, ice cream.  These are a special treat, only in small amounts. Eat plenty of vegetables.  Especially dark green, red, and orange vegetables. Aim for at least 3 servings a day. More is better! Include fruit in your daily diet.  Whole fruit is much healthier than fruit juice! Limit "white" bread, "white" pasta, "white" rice.   Choose "100% whole grain" products, brown or wild rice. Avoid fatty meats. Try "Meatless Monday" and choose eggs or beans one day a week.  When eating meat, choose lean meats like chicken, [place], and fish.  Grill, broil, or bake meats instead of frying, and eat poultry without the skin. Eat less salt.  Avoid frozen pizzas, frozen dinners and salty foods.  Use seasonings other than salt in cooking.  This can help blood pressure and keep you from swelling Beer, wine and liquor have calories.  If you can safely drink alcohol, limit to 1 drink per day for women, 2 drinks for men

## 2018-06-16 NOTE — Progress Notes (Signed)
Patient ID: Albert Miller, male    DOB: [birth year]  MRN: [id number]  CC: re-establish; Diabetes; and Hypertension   Subjective: Albert Miller is a 47 y.o. male who presents for chronic disease management.  Last seen 1 yr ago His concerns today include:  Pt with hx of DM, HTN and HL.  Last seen 1 yr ago.  He has been working for the past 3 months for a company that [employer].  Does a lot of lifting and going up and down steps.  -gets intermittent swelling in LT ankle and RT knee.  Constant Pain from ankles to knees x 1 wk. pain in the knees when walking up and down steps.  He was allowed to do light duty at work for the past 2 days.  However his boss has requested that he see a physician for his symptoms. -little stiffness in a.m for 20-30 mins -no fever/night sweats/wgh loss -not taking anything for symptoms  DM:  Check BS every few days.  Gives range 90-140 Med:  Taking Metformin 500 mg only once a day.  BID causes low BS -usually skips breakfast.  Does not snack during the day.  Dinner is usually the largest meal of the day but is usually fast food.   Drinks gatorade, water and diet sodas  HTN:  Has not taken lisinopril in over  6-7 mths.  He just stopped taking it for no particular reason. -no CP/SOB/LE edema  HL:  He has not taken her atorvastatin in over 6 to 7 months.   Patient Active Problem List   Diagnosis Date Noted  . High triglycerides 04/23/2017  . Uncontrolled type 2 DM with hyperosmolar nonketotic hyperglycemia ([place]) 04/19/2017  . HTN (hypertension) 04/19/2017     Current Outpatient Medications on File Prior to Visit  Medication Sig Dispense Refill  . aspirin 81 MG EC tablet Take 1 tablet (81 mg total) by mouth daily. 90 tablet 1  . blood glucose meter kit and supplies KIT Dispense based on patient and insurance preference. Use up to four times daily as directed. (FOR ICD-9 250.00, 250.01). 1 each 0  . Blood Glucose  Monitoring Suppl (TRUE METRIX METER) w/Device KIT one 1 kit 0  . Blood Glucose Monitoring Suppl DEVI True metrix, lancet 100 each 0  . glucose blood (TRUE METRIX BLOOD GLUCOSE TEST) test strip Use as instructed 100 each 12   No current facility-administered medications on file prior to visit.     No Known Allergies  Social History   Socioeconomic History  . Marital status: Single    Spouse name: Not on file  . Number of children: Not on file  . Years of education: Not on file  . Highest education level: Not on file  Occupational History  . Not on file  Social Needs  . Financial resource strain: Not on file  . Food insecurity:    Worry: Not on file    Inability: Not on file  . Transportation needs:    Medical: Not on file    Non-medical: Not on file  Tobacco Use  . Smoking status: Former Research scientist (life sciences)  . Smokeless tobacco: Never Used  Substance and Sexual Activity  . Alcohol use: No  . Drug use: No  . Sexual activity: Not Currently  Lifestyle  . Physical activity:    Days per week: Not on file    Minutes per session: Not on file  . Stress: Not on file  Relationships  .  Social connections:    Talks on phone: Not on file    Gets together: Not on file    Attends religious service: Not on file    Active member of club or organization: Not on file    Attends meetings of clubs or organizations: Not on file    Relationship status: Not on file  . Intimate partner violence:    Fear of current or ex partner: Not on file    Emotionally abused: Not on file    Physically abused: Not on file    Forced sexual activity: Not on file  Other Topics Concern  . Not on file  Social History Narrative  . Not on file    No family history on file.  No past surgical history on file.  ROS: Review of Systems  PHYSICAL EXAM: BP (!) 173/110 (BP Location: Left Arm, Cuff Size: Large) Comment: recheck  Pulse (!) 55   Temp 98.3 F (36.8 C) (Oral)   Resp 16   Ht 6' (1.829 m)   Wt 294 lb  12.8 oz (133.7 kg)   SpO2 98%   BMI 39.98 kg/m   Wt Readings from Last 3 Encounters:  06/16/18 294 lb 12.8 oz (133.7 kg)  05/15/17 287 lb 12.8 oz (130.5 kg)  04/23/17 292 lb 3.2 oz (132.5 kg)    Physical Exam  General appearance - alert, well appearing, obese middle-aged African-American male and in no distress Mental status - normal mood, behavior, speech, dress, motor activity, and thought processes Nose - normal and patent, no erythema, discharge or polyps Mouth - mucous membranes moist, pharynx normal without lesions Neck - supple, no significant adenopathy Chest - clear to auscultation, no wheezes, rales or rhonchi, symmetric air entry Heart - normal rate, regular rhythm, normal S1, S2, no murmurs, rubs, clicks or gallops Extremities - peripheral pulses normal, no pedal edema, no clubbing or cyanosis MSK: Knees: No edema or significant joint enlargement.  No point tenderness along the medial or lateral joint lines.  Mild discomfort with passive range of motion. Left ankle: Mild joint swelling laterally.  He has a 3 cm cyst on the lateral midfoot that is nontender to touch.  Mild discomfort with flexion and extension of the ankle. Results for orders placed or performed in visit on 06/16/18  POCT glucose (manual entry)  Result Value Ref Range   POC Glucose 136 (A) 70 - 99 mg/dl   Lab Results  Component Value Date   HGBA1C 13.1 (H) 04/19/2017    ASSESSMENT AND PLAN: 1. Controlled type 2 diabetes mellitus without complication, without long-term current use of insulin ([place]) Patient reports blood sugars have been good.  We will check an A1c today. Continue metformin I spent some time on dietary counseling.  Encourage him to back off on eating fast foods.  He will try to cook more.  Avoid sugary drinks. - POCT glucose (manual entry) - Hemoglobin A1c - Microalbumin / creatinine urine ratio - CBC - Comprehensive metabolic panel - Lipid panel - metFORMIN (GLUCOPHAGE) 500 MG  tablet; Take 1 tablet (500 mg total) by mouth daily with breakfast.  Dispense: 90 tablet; Refill: 1  2. Essential hypertension Goal is 130/80 or lower. Uncontrolled.  Discussed cardiovascular risks associated with uncontrolled blood pressure.  Patient to restart lisinopril.  He will see clinical pharmacist in about 3 weeks for recheck on blood pressure.  If still high we can add HCTZ 12.5 mg - lisinopril (PRINIVIL,ZESTRIL) 10 MG tablet; Take 1 tablet (10  mg total) by mouth daily.  Dispense: 90 tablet; Refill: 1  3. Hyperlipidemia, unspecified hyperlipidemia type Patient to restart Lipitor. - atorvastatin (LIPITOR) 20 MG tablet; Take 1 tablet (20 mg total) by mouth daily at 6 PM.  Dispense: 90 tablet; Refill: 1  4. Patellofemoral pain syndrome of both knees Note given for work requesting light duty for 1 month. - diclofenac sodium (VOLTAREN) 1 % GEL; Apply 2 g topically 4 (four) times daily.  Dispense: 100 g; Refill: 1  5. Acute left ankle pain - diclofenac sodium (VOLTAREN) 1 % GEL; Apply 2 g topically 4 (four) times daily.  Dispense: 100 g; Refill: 1  Patient was given the opportunity to ask questions.  Patient verbalized understanding of the plan and was able to repeat key elements of the plan.   Orders Placed This Encounter  Procedures  . Hemoglobin A1c  . Microalbumin / creatinine urine ratio  . CBC  . Comprehensive metabolic panel  . Lipid panel  . POCT glucose (manual entry)     Requested Prescriptions   Signed Prescriptions Disp Refills  . diclofenac sodium (VOLTAREN) 1 % GEL 100 g 1    Sig: Apply 2 g topically 4 (four) times daily.  . metFORMIN (GLUCOPHAGE) 500 MG tablet 90 tablet 1    Sig: Take 1 tablet (500 mg total) by mouth daily with breakfast.  . lisinopril (PRINIVIL,ZESTRIL) 10 MG tablet 90 tablet 1    Sig: Take 1 tablet (10 mg total) by mouth daily.  Marland Kitchen atorvastatin (LIPITOR) 20 MG tablet 90 tablet 1    Sig: Take 1 tablet (20 mg total) by mouth daily at 6 PM.      Return in about 3 months (around 09/16/2018).  Karle Plumber, MD, FACP

## 2018-06-16 NOTE — Progress Notes (Signed)
Pt states he is having pain coming from b/l ankle and feet  Pt states he have some edema in left leg

## 2018-06-17 ENCOUNTER — Telehealth: Payer: Self-pay

## 2018-06-17 LAB — LIPID PANEL
CHOLESTEROL TOTAL: 151 mg/dL (ref 100–199)
Chol/HDL Ratio: 2.6 ratio (ref 0.0–5.0)
HDL: 57 mg/dL (ref >39)
LDL Calculated: 80 mg/dL (ref 0–99)
Triglycerides: 69 mg/dL (ref 0–149)
VLDL CHOLESTEROL CAL: 14 mg/dL (ref 5–40)

## 2018-06-17 LAB — COMPREHENSIVE METABOLIC PANEL
A/G RATIO: 1.3 (ref 1.2–2.2)
ALBUMIN: 4.3 g/dL (ref 3.5–5.5)
ALT: 35 IU/L (ref 0–44)
AST: 26 IU/L (ref 0–40)
Alkaline Phosphatase: 92 IU/L (ref 39–117)
BILIRUBIN TOTAL: 0.4 mg/dL (ref 0.0–1.2)
BUN / CREAT RATIO: 12 (ref 9–20)
BUN: 13 mg/dL (ref 6–24)
CHLORIDE: 105 mmol/L (ref 96–106)
CO2: 22 mmol/L (ref 20–29)
Calcium: 9.5 mg/dL (ref 8.7–10.2)
Creatinine, Ser: 1.06 mg/dL (ref 0.76–1.27)
GFR calc non Af Amer: 84 mL/min/{1.73_m2} (ref >59)
GFR, EST AFRICAN AMERICAN: 97 mL/min/{1.73_m2} (ref >59)
GLOBULIN, TOTAL: 3.2 g/dL (ref 1.5–4.5)
Glucose: 129 mg/dL — ABNORMAL HIGH (ref 65–99)
Potassium: 4.3 mmol/L (ref 3.5–5.2)
SODIUM: 142 mmol/L (ref 134–144)
Total Protein: 7.5 g/dL (ref 6.0–8.5)

## 2018-06-17 LAB — CBC
HEMATOCRIT: 41.8 % (ref 37.5–51.0)
Hemoglobin: 14.3 g/dL (ref 13.0–17.7)
MCH: 31.4 pg (ref 26.6–33.0)
MCHC: 34.2 g/dL (ref 31.5–35.7)
MCV: 92 fL (ref 79–97)
Platelets: 270 10*3/uL (ref 150–450)
RBC: 4.55 x10E6/uL (ref 4.14–5.80)
RDW: 13.5 % (ref 12.3–15.4)
WBC: 5.1 10*3/uL (ref 3.4–10.8)

## 2018-06-17 LAB — MICROALBUMIN / CREATININE URINE RATIO
Creatinine, Urine: 233 mg/dL
MICROALBUM., U, RANDOM: 12.5 ug/mL
Microalb/Creat Ratio: 5.4 mg/g creat (ref 0.0–30.0)

## 2018-06-17 LAB — HEMOGLOBIN A1C
Est. average glucose Bld gHb Est-mCnc: 140 mg/dL
HEMOGLOBIN A1C: 6.5 % — AB (ref 4.8–5.6)

## 2018-06-17 NOTE — Telephone Encounter (Signed)
Contacted pt to go over lab results pt is aware and doesn't have any questions or concerns 

## 2018-07-08 NOTE — Progress Notes (Deleted)
   S:     Hyperlipidemia, HTN, DM  Patient arrives ***.    Presents to the clinic for hypertension evaluation, counseling, and management. Patient was referred by Dr. Marland Kitchen*** on ***.  Patient was last seen by Primary Care Provider on 06/16/18. Last Rx Clinic visit *** - at that time ***.  Stopped lisinopril 6-7 months. Didn't know why. She re-started lisinopril.   Patient {Actions; denies-reports:120008} adherence with medications.  Current BP Medications include:  ***  Antihypertensives tried in the past include: ***  Dietary habits include: *** Exercise habits include:*** Family / Social history: ***  ASCVD risk factors include:***  Home BP readings: ***   .[email or web address]   O:  Physical Exam   ROS  Last 3 Office BP readings: BP Readings from Last 3 Encounters:  06/16/18 (!) 173/110  05/15/17 (!) 157/91  04/23/17 135/76    BMET    Component Value Date/Time   NA 142 06/16/2018 1105   K 4.3 06/16/2018 1105   CL 105 06/16/2018 1105   CO2 22 06/16/2018 1105   GLUCOSE 129 (H) 06/16/2018 1105   GLUCOSE 250 (H) 04/21/2017 0356   BUN 13 06/16/2018 1105   CREATININE 1.06 06/16/2018 1105   CALCIUM 9.5 06/16/2018 1105   GFRNONAA 84 06/16/2018 1105   GFRAA 97 06/16/2018 1105    Renal function: CrCl cannot be calculated (Patient's most recent lab result is older than the maximum 21 days allowed.).  A/P: Hypertension longstanding/newly diagnosed currently *** on current medications. BP Goal <130/80 mmHg. Patient {Is/is not:9024} adherent with current medications.  -{Meds adjust:18428} ***.  -F/u labs ordered - *** -Counseled on lifestyle modifications for blood pressure control including reduced dietary sodium, increased exercise, adequate sleep  Results reviewed and written information provided.   Total time in face-to-face counseling *** minutes.   F/U Clinic Visit in ***.  Patient seen with ***

## 2018-07-09 ENCOUNTER — Encounter: Payer: 59 | Admitting: Pharmacist

## 2018-07-13 ENCOUNTER — Other Ambulatory Visit: Payer: Self-pay | Admitting: Pharmacist

## 2018-07-13 ENCOUNTER — Encounter: Payer: Self-pay | Admitting: Pharmacist

## 2018-07-13 ENCOUNTER — Ambulatory Visit: Payer: Managed Care, Other (non HMO) | Attending: Family Medicine | Admitting: Pharmacist

## 2018-07-13 VITALS — BP 152/97 | HR 60

## 2018-07-13 DIAGNOSIS — Z79899 Other long term (current) drug therapy: Secondary | ICD-10-CM | POA: Diagnosis not present

## 2018-07-13 DIAGNOSIS — Z7189 Other specified counseling: Secondary | ICD-10-CM | POA: Insufficient documentation

## 2018-07-13 DIAGNOSIS — I10 Essential (primary) hypertension: Secondary | ICD-10-CM | POA: Diagnosis not present

## 2018-07-13 MED ORDER — LISINOPRIL-HYDROCHLOROTHIAZIDE 10-12.5 MG PO TABS: 1.0000 | ORAL_TABLET | Freq: Every day | ORAL | 3 refills | Status: DC

## 2018-07-13 MED FILL — LISINOPRIL-HCTZ 10-12.5 MG: 10-12.5 | 30 days supply | Qty: 30 | Fill #0

## 2018-07-13 NOTE — Progress Notes (Signed)
   S:    PCP: Dr. Laural BenesJohnson  Patient arrives in good spirits.  Presents to the clinic for hypertension evaluation, counseling, and management. Patient was referred by Dr. Laural BenesJohnson and last seen by her on 06/16/18.  HPI:  Last office visit (06/16/18) - BP elevated at 173/110. Patient reported not taking lisinopril d/t no particular reason in over 6-7 months. Lisinopril resumed and pt informed to f/u with me.    Today, he denies CP, HA, SOB, or dizziness.  Reports adherence with the lisinopril. Of note, he did not take lisinopril this AM (reports missing 2x/wk). Reports no problems with lisinopril. He is to follow-up with Dr. Laural BenesJohnson later this week (07/16/18).   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; he does not currently work-out - "I know that I need to" Family / Social history:  - Family history: none noted in [facility]  - Tobacco: former smoker - Alcohol: denies use  ASCVD risk factors include: - HTN - DM  Home BP readings:  - Pt monitors at home with wrist cuff  - Reports home readings that are lower vs today's reading but did not give specific values  O:  L arm after 5 minutes rest: 152/97, HR 60 Last 3 Office BP readings: BP Readings from Last 3 Encounters:  06/16/18 (!) 173/110  05/15/17 (!) 157/91  04/23/17 135/76   BMET    Component Value Date/Time   NA 142 06/16/2018 1105   K 4.3 06/16/2018 1105   CL 105 06/16/2018 1105   CO2 22 06/16/2018 1105   GLUCOSE 129 (H) 06/16/2018 1105   GLUCOSE 250 (H) 04/21/2017 0356   BUN 13 06/16/2018 1105   CREATININE 1.06 06/16/2018 1105   CALCIUM 9.5 06/16/2018 1105   GFRNONAA 84 06/16/2018 1105   GFRAA 97 06/16/2018 1105   Renal function: CrCl cannot be calculated (Patient's most recent lab result is  older than the maximum 21 days allowed.).  A/P: Hypertension longstanding currently unctonrolled on lisinopril. BP Goal <130/80 mmHg. Patient is adherent with current medications. Of note, he does report missing 2 doses/wk.  Dr. Laural BenesJohnson recommends addition of HCTZ 12.5 mg. To increase adherence, will have patient start Zestoretic 10-12.5 mg daily. He is agreeable to this.   -Started lisinopril-HCTZ 10-12.5 mg daily. -Counseled on lifestyle modifications for blood pressure control including reduced dietary sodium, increased exercise, adequate sleep  Results reviewed and written information provided.   Total time in face-to-face counseling 15 minutes.   F/U Clinic Visit 07/16/18.    Patient seen with:  Mosie LukesMichele Muir, PharmD Candidate [facility] [employer] 2021  Butch PennyLuke Van Ausdall, PharmD, CPP Clinical Pharmacist [facility] [phone]

## 2018-07-13 NOTE — Patient Instructions (Addendum)
Thank you for coming to see Albert Miller today.   Blood pressure is improving but we want to try and get yours under 130/80. We are starting a new medication called lisinopril-HCTZ. You'll take this 1 time a day. Please call if you have any questions or concerns.   Try to remember to limit salt and caffeine. Try to exercise as you're able. The magic number is 30 minutes of exercise 5 days a week. You can do it!   Continue monitoring blood pressure at home. If you get any readings less than 90 on the top or less than 60 on the bottom please call me. My number is [phone].

## 2018-07-16 ENCOUNTER — Ambulatory Visit: Payer: Managed Care, Other (non HMO) | Admitting: Internal Medicine

## 2018-08-14 ENCOUNTER — Encounter: Payer: Self-pay | Admitting: Internal Medicine

## 2018-08-14 ENCOUNTER — Ambulatory Visit: Payer: Self-pay | Attending: Internal Medicine | Admitting: Internal Medicine

## 2018-08-14 VITALS — BP 160/103 | HR 58 | Temp 98.4°F | Resp 16 | Wt 300.4 lb

## 2018-08-14 DIAGNOSIS — Z87891 Personal history of nicotine dependence: Secondary | ICD-10-CM | POA: Insufficient documentation

## 2018-08-14 DIAGNOSIS — Z2821 Immunization not carried out because of patient refusal: Secondary | ICD-10-CM

## 2018-08-14 DIAGNOSIS — E1165 Type 2 diabetes mellitus with hyperglycemia: Secondary | ICD-10-CM | POA: Insufficient documentation

## 2018-08-14 DIAGNOSIS — E1100 Type 2 diabetes mellitus with hyperosmolarity without nonketotic hyperglycemic-hyperosmolar coma (NKHHC): Secondary | ICD-10-CM

## 2018-08-14 DIAGNOSIS — Z7982 Long term (current) use of aspirin: Secondary | ICD-10-CM | POA: Insufficient documentation

## 2018-08-14 DIAGNOSIS — Z6841 Body Mass Index (BMI) 40.0 and over, adult: Secondary | ICD-10-CM

## 2018-08-14 DIAGNOSIS — Z79899 Other long term (current) drug therapy: Secondary | ICD-10-CM | POA: Insufficient documentation

## 2018-08-14 DIAGNOSIS — E119 Type 2 diabetes mellitus without complications: Secondary | ICD-10-CM

## 2018-08-14 DIAGNOSIS — Z7984 Long term (current) use of oral hypoglycemic drugs: Secondary | ICD-10-CM | POA: Insufficient documentation

## 2018-08-14 DIAGNOSIS — E785 Hyperlipidemia, unspecified: Secondary | ICD-10-CM | POA: Insufficient documentation

## 2018-08-14 DIAGNOSIS — I10 Essential (primary) hypertension: Secondary | ICD-10-CM

## 2018-08-14 LAB — GLUCOSE, POCT (MANUAL RESULT ENTRY): POC Glucose: 152 mg/dl — AB (ref 70–99)

## 2018-08-14 MED ORDER — LISINOPRIL-HYDROCHLOROTHIAZIDE 10-12.5 MG PO TABS: 2.0000 | ORAL_TABLET | Freq: Every day | ORAL | 3 refills | Status: DC

## 2018-08-14 NOTE — Patient Instructions (Signed)
Increase lisinopril/HCTZ to 2 tablets daily.  Please cut back on eating fast foods and salty foods. Please try to check your blood pressure at least 2-3 times a week.  The goal is 130/80 or lower.

## 2018-08-14 NOTE — Progress Notes (Signed)
Patient ID: Albert Miller, male    DOB: December 22, 1970  MRN: [id number]  CC: Diabetes   Subjective: Albert Miller is a 47 y.o. male who presents for chronic disease management. His concerns today include:  Pt with hx of DM, HTN and HL.  HTN: saw clinical pharmacist since last visit.  Lisinopril changed to Lisinopril/HCTZ.  Admits that he takes the med but has been eating a lot of fast foods. Has BP monitor but not checking No CP/SOB/HA  DM:  Not checking BS but does  have device to check. Loss his job so he has been overeating.  Eating a lot of fast foods.  Not getting in any exercise.  He does have gym membership.  HM: Due for Tdap, Pneumovax and flu shot.  He declines.      Patient Active Problem List   Diagnosis Date Noted  . High triglycerides 04/23/2017  . Uncontrolled type 2 DM with hyperosmolar nonketotic hyperglycemia ([place]) 04/19/2017  . HTN (hypertension) 04/19/2017     Current Outpatient Medications on File Prior to Visit  Medication Sig Dispense Refill  . aspirin 81 MG EC tablet Take 1 tablet (81 mg total) by mouth daily. 90 tablet 1  . atorvastatin (LIPITOR) 20 MG tablet Take 1 tablet (20 mg total) by mouth daily at 6 PM. 90 tablet 1  . blood glucose meter kit and supplies KIT Dispense based on patient and insurance preference. Use up to four times daily as directed. (FOR ICD-9 250.00, 250.01). 1 each 0  . Blood Glucose Monitoring Suppl (TRUE METRIX METER) w/Device KIT one 1 kit 0  . Blood Glucose Monitoring Suppl DEVI True metrix, lancet 100 each 0  . diclofenac sodium (VOLTAREN) 1 % GEL Apply 2 g topically 4 (four) times daily. 100 g 1  . glucose blood (TRUE METRIX BLOOD GLUCOSE TEST) test strip Use as instructed 100 each 12  . metFORMIN (GLUCOPHAGE) 500 MG tablet Take 1 tablet (500 mg total) by mouth daily with breakfast. 90 tablet 1   No current facility-administered medications on file prior to visit.     No Known Allergies  Social History    Socioeconomic History  . Marital status: Single    Spouse name: Not on file  . Number of children: Not on file  . Years of education: Not on file  . Highest education level: Not on file  Occupational History  . Not on file  Social Needs  . Financial resource strain: Not on file  . Food insecurity:    Worry: Not on file    Inability: Not on file  . Transportation needs:    Medical: Not on file    Non-medical: Not on file  Tobacco Use  . Smoking status: Former Research scientist (life sciences)  . Smokeless tobacco: Never Used  Substance and Sexual Activity  . Alcohol use: No  . Drug use: No  . Sexual activity: Not Currently  Lifestyle  . Physical activity:    Days per week: Not on file    Minutes per session: Not on file  . Stress: Not on file  Relationships  . Social connections:    Talks on phone: Not on file    Gets together: Not on file    Attends religious service: Not on file    Active member of club or organization: Not on file    Attends meetings of clubs or organizations: Not on file    Relationship status: Not on file  . Intimate partner violence:  Fear of current or ex partner: Not on file    Emotionally abused: Not on file    Physically abused: Not on file    Forced sexual activity: Not on file  Other Topics Concern  . Not on file  Social History Narrative  . Not on file    No family history on file.  No past surgical history on file.  ROS: Review of Systems Neg except as above PHYSICAL EXAM: BP (!) 160/103 (BP Location: Left Arm, Cuff Size: Large) Comment: recheck  Pulse (!) 58   Temp 98.4 F (36.9 C) (Oral)   Resp 16   Wt (!) 300 lb 6.4 oz (136.3 kg)   SpO2 97%   BMI 40.74 kg/m   Wt Readings from Last 3 Encounters:  08/14/18 (!) 300 lb 6.4 oz (136.3 kg)  06/16/18 294 lb 12.8 oz (133.7 kg)  05/15/17 287 lb 12.8 oz (130.5 kg)   Physical Exam  General appearance - alert, well appearing, and in no distress Mental status - normal mood, behavior, speech,  dress, motor activity, and thought processes Neck - supple, no significant adenopathy Chest - clear to auscultation, no wheezes, rales or rhonchi, symmetric air entry Heart - normal rate, regular rhythm, normal S1, S2, no murmurs, rubs, clicks or gallops Extremities - peripheral pulses normal, no pedal edema, no clubbing or cyanosis  Results for orders placed or performed in visit on 08/14/18  POCT glucose (manual entry)  Result Value Ref Range   POC Glucose 152 (A) 70 - 99 mg/dl   Lab Results  Component Value Date   CHOL 151 06/16/2018   HDL 57 06/16/2018   LDLCALC 80 06/16/2018   TRIG 69 06/16/2018   CHOLHDL 2.6 06/16/2018     Chemistry      Component Value Date/Time   NA 142 06/16/2018 1105   K 4.3 06/16/2018 1105   CL 105 06/16/2018 1105   CO2 22 06/16/2018 1105   BUN 13 06/16/2018 1105   CREATININE 1.06 06/16/2018 1105      Component Value Date/Time   CALCIUM 9.5 06/16/2018 1105   ALKPHOS 92 06/16/2018 1105   AST 26 06/16/2018 1105   ALT 35 06/16/2018 1105   BILITOT 0.4 06/16/2018 1105      ASSESSMENT AND PLAN: 1. Essential hypertension Not at goal.  Increase lisinopril HCTZ to 20/25 mg daily.  DASH diet discussed and encouraged.  2. Controlled type 2 diabetes mellitus without complication, without long-term current use of insulin ([place]) Encourage patient to check blood sugars. Dietary counseling given. Continue metformin.  Encourage him to get in some form of regular exercise at least 3 to 4 days a week for 45 minutes.  He plans to start going to the gym - POCT glucose (manual entry)  3. Class 3 severe obesity due to excess calories with serious comorbidity and body mass index (BMI) of 40.0 to 44.9 in adult [facility]) See #2 above  4. Influenza vaccination declined   5. Tetanus, diphtheria, and acellular pertussis (Tdap) vaccination declined  Patient was given the opportunity to ask questions.  Patient verbalized understanding of the plan and was able to repeat  key elements of the plan.   Orders Placed This Encounter  Procedures  . POCT glucose (manual entry)     Requested Prescriptions   Signed Prescriptions Disp Refills  . lisinopril-hydrochlorothiazide (PRINZIDE,ZESTORETIC) 10-12.5 MG tablet 180 tablet 3    Sig: Take 2 tablets by mouth daily.    Return in about 4 weeks (  around 09/11/2018).  Karle Plumber, MD, FACP

## 2018-08-19 MED FILL — LISINOPRIL-HCTZ 10-12.5 MG: 10-12.5 | 30 days supply | Qty: 30 | Fill #1

## 2018-08-19 MED FILL — metFORMIN HCL 500 MG TABS: 500 | 30 days supply | Qty: 30 | Fill #1

## 2018-09-17 ENCOUNTER — Ambulatory Visit: Payer: Self-pay | Attending: Internal Medicine | Admitting: Internal Medicine

## 2018-09-17 ENCOUNTER — Encounter: Payer: Self-pay | Admitting: Internal Medicine

## 2018-09-17 VITALS — BP 167/103 | HR 91 | Temp 98.1°F | Resp 16 | Wt 308.0 lb

## 2018-09-17 DIAGNOSIS — E785 Hyperlipidemia, unspecified: Secondary | ICD-10-CM | POA: Insufficient documentation

## 2018-09-17 DIAGNOSIS — Z6841 Body Mass Index (BMI) 40.0 and over, adult: Secondary | ICD-10-CM | POA: Insufficient documentation

## 2018-09-17 DIAGNOSIS — Z7982 Long term (current) use of aspirin: Secondary | ICD-10-CM | POA: Insufficient documentation

## 2018-09-17 DIAGNOSIS — R4589 Other symptoms and signs involving emotional state: Secondary | ICD-10-CM

## 2018-09-17 DIAGNOSIS — IMO0001 Reserved for inherently not codable concepts without codable children: Secondary | ICD-10-CM

## 2018-09-17 DIAGNOSIS — I10 Essential (primary) hypertension: Secondary | ICD-10-CM | POA: Insufficient documentation

## 2018-09-17 DIAGNOSIS — E1165 Type 2 diabetes mellitus with hyperglycemia: Secondary | ICD-10-CM | POA: Insufficient documentation

## 2018-09-17 DIAGNOSIS — E1100 Type 2 diabetes mellitus with hyperosmolarity without nonketotic hyperglycemic-hyperosmolar coma (NKHHC): Secondary | ICD-10-CM

## 2018-09-17 DIAGNOSIS — Z23 Encounter for immunization: Secondary | ICD-10-CM | POA: Insufficient documentation

## 2018-09-17 DIAGNOSIS — B353 Tinea pedis: Secondary | ICD-10-CM | POA: Insufficient documentation

## 2018-09-17 DIAGNOSIS — Z6838 Body mass index (BMI) 38.0-38.9, adult: Secondary | ICD-10-CM | POA: Insufficient documentation

## 2018-09-17 DIAGNOSIS — F329 Major depressive disorder, single episode, unspecified: Secondary | ICD-10-CM | POA: Insufficient documentation

## 2018-09-17 DIAGNOSIS — Z87891 Personal history of nicotine dependence: Secondary | ICD-10-CM | POA: Insufficient documentation

## 2018-09-17 DIAGNOSIS — Z79899 Other long term (current) drug therapy: Secondary | ICD-10-CM | POA: Insufficient documentation

## 2018-09-17 LAB — POCT GLYCOSYLATED HEMOGLOBIN (HGB A1C): HbA1c, POC (controlled diabetic range): 8.5 % — AB (ref 0.0–7.0)

## 2018-09-17 LAB — GLUCOSE, POCT (MANUAL RESULT ENTRY): POC GLUCOSE: 260 mg/dL — AB (ref 70–99)

## 2018-09-17 MED ORDER — METFORMIN HCL 500 MG PO TABS: 500.0000 mg | ORAL_TABLET | Freq: Every day | ORAL | 1 refills | Status: DC

## 2018-09-17 MED ORDER — TETANUS-DIPHTH-ACELL PERTUSSIS 5-2.5-18.5 LF-MCG/0.5 IM SUSP: 0.5000 mL | Freq: Once | INTRAMUSCULAR | 0 refills | Status: AC

## 2018-09-17 MED ORDER — TERBINAFINE HCL 1 % EX CREA: 1.0000 "application " | TOPICAL_CREAM | Freq: Two times a day (BID) | CUTANEOUS | 0 refills | Status: DC

## 2018-09-17 MED ORDER — LISINOPRIL-HYDROCHLOROTHIAZIDE 20-25 MG PO TABS: 1.0000 | ORAL_TABLET | Freq: Every day | ORAL | 3 refills | Status: DC

## 2018-09-17 MED ORDER — AMLODIPINE BESYLATE 5 MG PO TABS: 5.0000 mg | ORAL_TABLET | Freq: Every day | ORAL | 3 refills | Status: DC

## 2018-09-17 MED ORDER — ATORVASTATIN CALCIUM 20 MG PO TABS: 20.0000 mg | ORAL_TABLET | Freq: Every day | ORAL | 1 refills | Status: DC

## 2018-09-17 MED FILL — metFORMIN HCL 500 MG TABS: 500 | 30 days supply | Qty: 30 | Fill #0

## 2018-09-17 MED FILL — ATORVASTATIN 20 MG TABLET: 20 | 30 days supply | Qty: 30 | Fill #0

## 2018-09-17 MED FILL — AMLODIPINE BESYLATE 5 MG TA: 5 | 30 days supply | Qty: 30 | Fill #0

## 2018-09-17 MED FILL — LISINOPRIL-HCTZ 20-25 MG TA: 20-25 | 30 days supply | Qty: 30 | Fill #0

## 2018-09-17 NOTE — Progress Notes (Signed)
Pt states he put some old gym shoes on and he thinks it infected his foot

## 2018-09-17 NOTE — Progress Notes (Signed)
Patient ID: Albert Miller, male    DOB: 01/06/1971  MRN: [id number]  CC: No chief complaint on file.   Subjective: Albert Miller is a 47 y.o. male who presents for 1 mth f/u DM/HTN His concerns today include:  Pt with hx of DM, HTN and HL.  Planning to relocate to [place] within the next 2 months.  HTN: Lisinopril/HCTZ dose increased on last visit.  He reports compliance with the medication. Checks BP 2 x a mth.  Last check 2 wks ago was 129/90s Tries to limit regular salt No CP/SOB/LE edema  DM: gained 14 lbs since July of this yr.  He has not been to the gym in about 1 mth.  "I've been in a slump."  Trying to find a job and watches TV a lot.  Endorses depressed mood since not having a job.  He also states that his girlfriend grandchildren used to, around a lot and now they do not and he misses their company. Eating a lot of white carbs. Takes Metformin "a couple times a week."  Denies any S.E No blurred vision.  Last eye exam was about 1 yr ago.  He thinks he has an infection on the plantar surface of the left fourth toe.  He states that it feels sore.  Complains that the feet have been very dry.  Endorses itching. Patient Active Problem List   Diagnosis Date Noted  . High triglycerides 04/23/2017  . Uncontrolled type 2 DM with hyperosmolar nonketotic hyperglycemia ([place]) 04/19/2017  . HTN (hypertension) 04/19/2017     Current Outpatient Medications on File Prior to Visit  Medication Sig Dispense Refill  . aspirin 81 MG EC tablet Take 1 tablet (81 mg total) by mouth daily. 90 tablet 1  . blood glucose meter kit and supplies KIT Dispense based on patient and insurance preference. Use up to four times daily as directed. (FOR ICD-9 250.00, 250.01). 1 each 0  . Blood Glucose Monitoring Suppl (TRUE METRIX METER) w/Device KIT one 1 kit 0  . Blood Glucose Monitoring Suppl DEVI True metrix, lancet 100 each 0  . diclofenac sodium (VOLTAREN) 1 % GEL Apply 2 g topically 4  (four) times daily. 100 g 1  . glucose blood (TRUE METRIX BLOOD GLUCOSE TEST) test strip Use as instructed 100 each 12   No current facility-administered medications on file prior to visit.     No Known Allergies  Social History   Socioeconomic History  . Marital status: Single    Spouse name: Not on file  . Number of children: Not on file  . Years of education: Not on file  . Highest education level: Not on file  Occupational History  . Not on file  Social Needs  . Financial resource strain: Not on file  . Food insecurity:    Worry: Not on file    Inability: Not on file  . Transportation needs:    Medical: Not on file    Non-medical: Not on file  Tobacco Use  . Smoking status: Former Research scientist (life sciences)  . Smokeless tobacco: Never Used  Substance and Sexual Activity  . Alcohol use: No  . Drug use: No  . Sexual activity: Not Currently  Lifestyle  . Physical activity:    Days per week: Not on file    Minutes per session: Not on file  . Stress: Not on file  Relationships  . Social connections:    Talks on phone: Not on file    Gets  together: Not on file    Attends religious service: Not on file    Active member of club or organization: Not on file    Attends meetings of clubs or organizations: Not on file    Relationship status: Not on file  . Intimate partner violence:    Fear of current or ex partner: Not on file    Emotionally abused: Not on file    Physically abused: Not on file    Forced sexual activity: Not on file  Other Topics Concern  . Not on file  Social History Narrative  . Not on file    No family history on file.  No past surgical history on file.  ROS: Review of Systems Negative except as above. PHYSICAL EXAM: BP (!) 167/103 (BP Location: Left Arm, Cuff Size: Large) Comment: recheck  Pulse 91   Temp 98.1 F (36.7 C) (Oral)   Resp 16   Wt (!) 308 lb (139.7 kg)   SpO2 97%   BMI 41.77 kg/m   Wt Readings from Last 3 Encounters:  09/17/18 (!) 308  lb (139.7 kg)  08/14/18 (!) 300 lb 6.4 oz (136.3 kg)  06/16/18 294 lb 12.8 oz (133.7 kg)  BP 158/102  Physical Exam  General appearance - alert, well appearing, and in no distress Mental status - normal mood, behavior, speech, dress, motor activity, and thought processes Neck - supple, no significant adenopathy Chest - clear to auscultation, no wheezes, rales or rhonchi, symmetric air entry Heart - normal rate, regular rhythm, normal S1, S2, no murmurs, rubs, clicks or gallops Extremities - peripheral pulses normal, no pedal edema, no clubbing or cyanosis Diabetic Foot Exam - Simple   Simple Foot Form Visual Inspection See comments:  Yes Sensation Testing Intact to touch and monofilament testing bilaterally:  Yes Pulse Check Posterior Tibialis and Dorsalis pulse intact bilaterally:  Yes Comments Patient has peeling of skin around the perimeter and soles of both feet.  Skin is cracked at the base of the right fourth toe plantar aspect.  No edema, erythema or fluctuance noted.     Results for orders placed or performed in visit on 09/17/18  POCT glucose (manual entry)  Result Value Ref Range   POC Glucose 260 (A) 70 - 99 mg/dl  POCT glycosylated hemoglobin (Hb A1C)  Result Value Ref Range   Hemoglobin A1C     HbA1c POC (<> result, manual entry)     HbA1c, POC (prediabetic range)     HbA1c, POC (controlled diabetic range) 8.5 (A) 0.0 - 7.0 %   Depression screen [facility] 2/9 09/17/2018 06/16/2018 05/15/2017  Decreased Interest 1 2 0  Down, Depressed, Hopeless 1 1 0  PHQ - 2 Score 2 3 0  Altered sleeping 1 1 0  Tired, decreased energy 1 1 0  Change in appetite 1 1 0  Feeling bad or failure about yourself  0 1 0  Trouble concentrating 0 1 0  Moving slowly or fidgety/restless 0 1 0  Suicidal thoughts 0 0 0  PHQ-9 Score 5 9 0    ASSESSMENT AND PLAN: 1. Uncontrolled type 2 diabetes mellitus without complication, without long-term current use of insulin ([place]) Stressed the  importance of compliance with medication and eating habits.  Encourage him to take the metformin daily as prescribed.  He promised that he will do so especially since he has seen the jump in the A1c. - POCT glucose (manual entry) - POCT glycosylated hemoglobin (Hb A1C) - metFORMIN (GLUCOPHAGE)  500 MG tablet; Take 1 tablet (500 mg total) by mouth daily with breakfast.  Dispense: 90 tablet; Refill: 1  2. Essential hypertension Not at goal.  Continue to limit salt in the foods. Add Norvasc 5 mg daily. - amLODipine (NORVASC) 5 MG tablet; Take 1 tablet (5 mg total) by mouth daily.  Dispense: 90 tablet; Refill: 3 - lisinopril-hydrochlorothiazide (PRINZIDE,ZESTORETIC) 20-25 MG tablet; Take 1 tablet by mouth daily.  Dispense: 90 tablet; Refill: 3  3. Need for immunization against influenza - Flu Vaccine QUAD 36+ mos IM  4. Need for Tdap vaccination 5. Need for vaccination for Strep pneumoniae Both vaccines were given.  6. Tinea pedis of both feet - terbinafine (LAMISIL AT) 1 % cream; Apply 1 application topically 2 (two) times daily.  Dispense: 30 g; Refill: 0  7. Hyperlipidemia, unspecified hyperlipidemia type - atorvastatin (LIPITOR) 20 MG tablet; Take 1 tablet (20 mg total) by mouth daily at 6 PM.  Dispense: 90 tablet; Refill: 1  8. Class 3 severe obesity due to excess calories with serious comorbidity and body mass index (BMI) of 40.0 to 44.9 in adult [facility]) Patient plans to get back into exercising.  He also set a goal to try cut back on his portion sizes  9. Dysphoric mood Patient declines being LCSW.  He does not feel that he needs to be on any medication at this time   Patient was given the opportunity to ask questions.  Patient verbalized understanding of the plan and was able to repeat key elements of the plan.   Orders Placed This Encounter  Procedures  . Flu Vaccine QUAD 36+ mos IM  . POCT glucose (manual entry)  . POCT glycosylated hemoglobin (Hb A1C)     Requested  Prescriptions   Signed Prescriptions Disp Refills  . Tdap (BOOSTRIX) 5-2.5-18.5 LF-MCG/0.5 injection 0.5 mL 0    Sig: Inject 0.5 mLs into the muscle once for 1 dose.  Marland Kitchen amLODipine (NORVASC) 5 MG tablet 90 tablet 3    Sig: Take 1 tablet (5 mg total) by mouth daily.  Marland Kitchen atorvastatin (LIPITOR) 20 MG tablet 90 tablet 1    Sig: Take 1 tablet (20 mg total) by mouth daily at 6 PM.  . metFORMIN (GLUCOPHAGE) 500 MG tablet 90 tablet 1    Sig: Take 1 tablet (500 mg total) by mouth daily with breakfast.  . terbinafine (LAMISIL AT) 1 % cream 30 g 0    Sig: Apply 1 application topically 2 (two) times daily.  Marland Kitchen lisinopril-hydrochlorothiazide (PRINZIDE,ZESTORETIC) 20-25 MG tablet 90 tablet 3    Sig: Take 1 tablet by mouth daily.    Return in about 6 weeks (around 10/29/2018).  Karle Plumber, MD, FACP

## 2018-09-17 NOTE — Patient Instructions (Addendum)
Your blood pressure is not well controlled.  Please try to cut back on using regular salt in the foods.  We have added a medication called amlodipine 5 mg daily to help better control your blood pressure.  Try to check blood pressure at least once a week and bring in readings on next visit.  Your diabetes is not well controlled.  Please take the metformin every day as prescribed.  Try to get in some form of regular exercise at least 3-4 times a week for 30 minutes.  Try to cut back on your portion sizes and white carbohydrates.  Td Vaccine (Tetanus and Diphtheria): What You Need to Know 1. Why get vaccinated? Tetanus  and diphtheria are very serious diseases. They are rare in the [place] today, but people who do become infected often have severe complications. Td vaccine is used to protect adolescents and adults from both of these diseases. Both tetanus and diphtheria are infections caused by bacteria. Diphtheria spreads from person to person through coughing or sneezing. Tetanus-causing bacteria enter the body through cuts, scratches, or wounds. TETANUS (lockjaw) causes painful muscle tightening and stiffness, usually all over the body.  It can lead to tightening of muscles in the head and neck so you can't open your mouth, swallow, or sometimes even breathe. Tetanus kills about 1 out of every 10 people who are infected even after receiving the best medical care.  DIPHTHERIA can cause a thick coating to form in the back of the throat.  It can lead to breathing problems, paralysis, heart failure, and death.  Before vaccines, as many as 200,000 cases of diphtheria and hundreds of cases of tetanus were reported in the [place] each year. Since vaccination began, reports of cases for both diseases have dropped by about 99%. 2. Td vaccine Td vaccine can protect adolescents and adults from tetanus and diphtheria. Td is usually given as a booster dose every 10 years but it can also be given  earlier after a severe and dirty wound or burn. Another vaccine, called Tdap, which protects against pertussis in addition to tetanus and diphtheria, is sometimes recommended instead of Td vaccine. Your doctor or the person giving you the vaccine can give you more information. Td may safely be given at the same time as other vaccines. 3. Some people should not get this vaccine  A person who has ever had a life-threatening allergic reaction after a previous dose of any tetanus or diphtheria containing vaccine, OR has a severe allergy to any part of this vaccine, should not get Td vaccine. Tell the person giving the vaccine about any severe allergies.  Talk to your doctor if you: ? had severe pain or swelling after any vaccine containing diphtheria or tetanus, ? ever had a condition called Guillain Barre Syndrome (GBS), ? aren't feeling well on the day the shot is scheduled. 4. What are the risks from Td vaccine? With any medicine, including vaccines, there is a chance of side effects. These are usually mild and go away on their own. Serious reactions are also possible but are rare. Most people who get Td vaccine do not have any problems with it. Mild problems following Td vaccine: (Did not interfere with activities)  Pain where the shot was given (about 8 people in 10)  Redness or swelling where the shot was given (about 1 person in 4)  Mild fever (rare)  Headache (about 1 person in 4)  Tiredness (about 1 person in 4)  Moderate  problems following Td vaccine: (Interfered with activities, but did not require medical attention)  Fever over 102F (rare)  Severe problems following Td vaccine: (Unable to perform usual activities; required medical attention)  Swelling, severe pain, bleeding and/or redness in the arm where the shot was given (rare).  Problems that could happen after any vaccine:  People sometimes faint after a medical procedure, including vaccination. Sitting or lying  down for about 15 minutes can help prevent fainting, and injuries caused by a fall. Tell your doctor if you feel dizzy, or have vision changes or ringing in the ears.  Some people get severe pain in the shoulder and have difficulty moving the arm where a shot was given. This happens very rarely.  Any medication can cause a severe allergic reaction. Such reactions from a vaccine are very rare, estimated at fewer than 1 in a million doses, and would happen within a few minutes to a few hours after the vaccination. As with any medicine, there is a very remote chance of a vaccine causing a serious injury or death. The safety of vaccines is always being monitored. For more information, visit: http://floyd.org/ 5. What if there is a serious reaction? What should I look for? Look for anything that concerns you, such as signs of a severe allergic reaction, very high fever, or unusual behavior. Signs of a severe allergic reaction can include hives, swelling of the face and throat, difficulty breathing, a fast heartbeat, dizziness, and weakness. These would usually start a few minutes to a few hours after the vaccination. What should I do?  If you think it is a severe allergic reaction or other emergency that can't wait, call 9-1-1 or get the person to the nearest hospital. Otherwise, call your doctor.  Afterward, the reaction should be reported to the Vaccine Adverse Event Reporting System (VAERS). Your doctor might file this report, or you can do it yourself through the VAERS web site at www.vaers.LAgents.no, or by calling 1-931 855 5824. ? VAERS does not give medical advice. 6. The National Vaccine Injury Compensation Program The [employer] Vaccine Injury Compensation Program (VICP) is a federal program that was created to compensate people who may have been injured by certain vaccines. Persons who believe they may have been injured by a vaccine can learn about the program and about filing a claim by  calling 1-229-272-0630 or visiting the VICP website at SpiritualWord.at. There is a time limit to file a claim for compensation. 7. How can I learn more?  Ask your doctor. He or she can give you the vaccine package insert or suggest other sources of information.  Call your local or state health department.  Contact the Centers for Disease Control and Prevention (CDC): ? Call (316)470-4138 (1-800-CDC-INFO) ? Visit CDC's website at PicCapture.uy CDC Td Vaccine VIS (03/12/16) This information is not intended to replace advice given to you by your health care provider. Make sure you discuss any questions you have with your health care provider. Document Released: 09/15/2006 Document Revised: 08/08/2016 Document Reviewed: 08/08/2016 Elsevier Interactive Patient Education  2017 Elsevier Inc.  Pneumococcal Polysaccharide Vaccine: What You Need to Know 1. Why get vaccinated? Vaccination can protect older adults (and some children and younger adults) from pneumococcal disease. Pneumococcal disease is caused by bacteria that can spread from person to person through close contact. It can cause ear infections, and it can also lead to more serious infections of the:  Lungs (pneumonia),  Blood (bacteremia), and  Covering of the brain and spinal cord (  meningitis). Meningitis can cause deafness and brain damage, and it can be fatal.  Anyone can get pneumococcal disease, but children under 47 years of age, people with certain medical conditions, adults over 22 years of age, and cigarette smokers are at the highest risk. About 18,000 older adults die each year from pneumococcal disease in the [place]. Treatment of pneumococcal infections with penicillin and other drugs used to be more effective. But some strains of the disease have become resistant to these drugs. This makes prevention of the disease, through vaccination, even more important. 2. Pneumococcal polysaccharide  vaccine (PPSV23) Pneumococcal polysaccharide vaccine (PPSV23) protects against 23 types of pneumococcal bacteria. It will not prevent all pneumococcal disease. PPSV23 is recommended for:  All adults 63 years of age and older,  Anyone 2 through 47 years of age with certain long-term health problems,  Anyone 2 through 47 years of age with a weakened immune system,  Adults 68 through 47 years of age who smoke cigarettes or have asthma.  Most people need only one dose of PPSV. A second dose is recommended for certain high-risk groups. People 73 and older should get a dose even if they have gotten one or more doses of the vaccine before they turned 65. Your healthcare provider can give you more information about these recommendations. Most healthy adults develop protection within 2 to 3 weeks of getting the shot. 3. Some people should not get this vaccine  Anyone who has had a life-threatening allergic reaction to PPSV should not get another dose.  Anyone who has a severe allergy to any component of PPSV should not receive it. Tell your provider if you have any severe allergies.  Anyone who is moderately or severely ill when the shot is scheduled may be asked to wait until they recover before getting the vaccine. Someone with a mild illness can usually be vaccinated.  Children less than 105 years of age should not receive this vaccine.  There is no evidence that PPSV is harmful to either a pregnant woman or to her fetus. However, as a precaution, women who need the vaccine should be vaccinated before becoming pregnant, if possible. 4. Risks of a vaccine reaction With any medicine, including vaccines, there is a chance of side effects. These are usually mild and go away on their own, but serious reactions are also possible. About half of people who get PPSV have mild side effects, such as redness or pain where the shot is given, which go away within about two days. Less than 1 out of 100 people  develop a fever, muscle aches, or more severe local reactions. Problems that could happen after any vaccine:  People sometimes faint after a medical procedure, including vaccination. Sitting or lying down for about 15 minutes can help prevent fainting, and injuries caused by a fall. Tell your doctor if you feel dizzy, or have vision changes or ringing in the ears.  Some people get severe pain in the shoulder and have difficulty moving the arm where a shot was given. This happens very rarely.  Any medication can cause a severe allergic reaction. Such reactions from a vaccine are very rare, estimated at about 1 in a million doses, and would happen within a few minutes to a few hours after the vaccination. As with any medicine, there is a very remote chance of a vaccine causing a serious injury or death. The safety of vaccines is always being monitored. For more information, visit: http://floyd.org/ 5. What if  there is a serious reaction? What should I look for? Look for anything that concerns you, such as signs of a severe allergic reaction, very high fever, or unusual behavior. Signs of a severe allergic reaction can include hives, swelling of the face and throat, difficulty breathing, a fast heartbeat, dizziness, and weakness. These would usually start a few minutes to a few hours after the vaccination. What should I do? If you think it is a severe allergic reaction or other emergency that can't wait, call 9-1-1 or get to the nearest hospital. Otherwise, call your doctor. Afterward, the reaction should be reported to the Vaccine Adverse Event Reporting System (VAERS). Your doctor might file this report, or you can do it yourself through the VAERS web site at www.vaers.LAgents.no, or by calling 1-859-515-1522. VAERS does not give medical advice. 6. How can I learn more?  Ask your doctor. He or she can give you the vaccine package insert or suggest other sources of information.  Call your  local or state health department.  Contact the Centers for Disease Control and Prevention (CDC): ? Call 563-170-2609 (1-800-CDC-INFO) or ? Visit CDC's website at PicCapture.uy CDC Pneumococcal Polysaccharide Vaccine VIS (03/25/14) This information is not intended to replace advice given to you by your health care provider. Make sure you discuss any questions you have with your health care provider. Document Released: 09/15/2006 Document Revised: 08/08/2016 Document Reviewed: 08/08/2016 Elsevier Interactive Patient Education  2017 Elsevier Inc.  Influenza Virus Vaccine injection (Fluarix) What is this medicine? INFLUENZA VIRUS VACCINE (in floo EN zuh VAHY ruhs vak SEEN) helps to reduce the risk of getting influenza also known as the flu. This medicine may be used for other purposes; ask your health care provider or pharmacist if you have questions. COMMON BRAND NAME(S): Fluarix, Fluzone What should I tell my health care provider before I take this medicine? They need to know if you have any of these conditions: -bleeding disorder like hemophilia -fever or infection -Guillain-Barre syndrome or other neurological problems -immune system problems -infection with the human immunodeficiency virus (HIV) or AIDS -low blood platelet counts -multiple sclerosis -an unusual or allergic reaction to influenza virus vaccine, eggs, chicken proteins, latex, gentamicin, other medicines, foods, dyes or preservatives -pregnant or trying to get pregnant -breast-feeding How should I use this medicine? This vaccine is for injection into a muscle. It is given by a health care professional. A copy of Vaccine Information Statements will be given before each vaccination. Read this sheet carefully each time. The sheet may change frequently. Talk to your pediatrician regarding the use of this medicine in children. Special care may be needed. Overdosage: If you think you have taken too much of this  medicine contact a poison control center or emergency room at once. NOTE: This medicine is only for you. Do not share this medicine with others. What if I miss a dose? This does not apply. What may interact with this medicine? -chemotherapy or radiation therapy -medicines that lower your immune system like etanercept, anakinra, infliximab, and adalimumab -medicines that treat or prevent blood clots like warfarin -phenytoin -steroid medicines like prednisone or cortisone -theophylline -vaccines This list may not describe all possible interactions. Give your health care provider a list of all the medicines, herbs, non-prescription drugs, or dietary supplements you use. Also tell them if you smoke, drink alcohol, or use illegal drugs. Some items may interact with your medicine. What should I watch for while using this medicine? Report any side effects that do not  go away within 3 days to your doctor or health care professional. Call your health care provider if any unusual symptoms occur within 6 weeks of receiving this vaccine. You may still catch the flu, but the illness is not usually as bad. You cannot get the flu from the vaccine. The vaccine will not protect against colds or other illnesses that may cause fever. The vaccine is needed every year. What side effects may I notice from receiving this medicine? Side effects that you should report to your doctor or health care professional as soon as possible: -allergic reactions like skin rash, itching or hives, swelling of the face, lips, or tongue Side effects that usually do not require medical attention (report to your doctor or health care professional if they continue or are bothersome): -fever -headache -muscle aches and pains -pain, tenderness, redness, or swelling at site where injected -weak or tired This list may not describe all possible side effects. Call your doctor for medical advice about side effects. You may report side  effects to FDA at 1-800-FDA-1088. Where should I keep my medicine? This vaccine is only given in a clinic, pharmacy, doctor's office, or other health care setting and will not be stored at home. NOTE: This sheet is a summary. It may not cover all possible information. If you have questions about this medicine, talk to your doctor, pharmacist, or health care provider.  2018 Elsevier/Gold Standard (2008-06-15 09:30:40)

## 2018-10-15 MED FILL — TRUE METRIX TEST STRIP: 30 days supply | Qty: 100 | Fill #1

## 2018-11-03 ENCOUNTER — Ambulatory Visit: Payer: Self-pay | Admitting: Internal Medicine

## 2018-11-12 ENCOUNTER — Ambulatory Visit: Payer: Self-pay | Admitting: Internal Medicine

## 2018-11-27 IMAGING — DX DG CHEST 2V
2 series · 2 of 2 positions shown · non-contrast
Comparison: None.

CLINICAL DATA: Mid chest pain and dizziness for 3 days. Visual
changes and polyuria.

EXAM:
CHEST  2 VIEW

[chest pa]
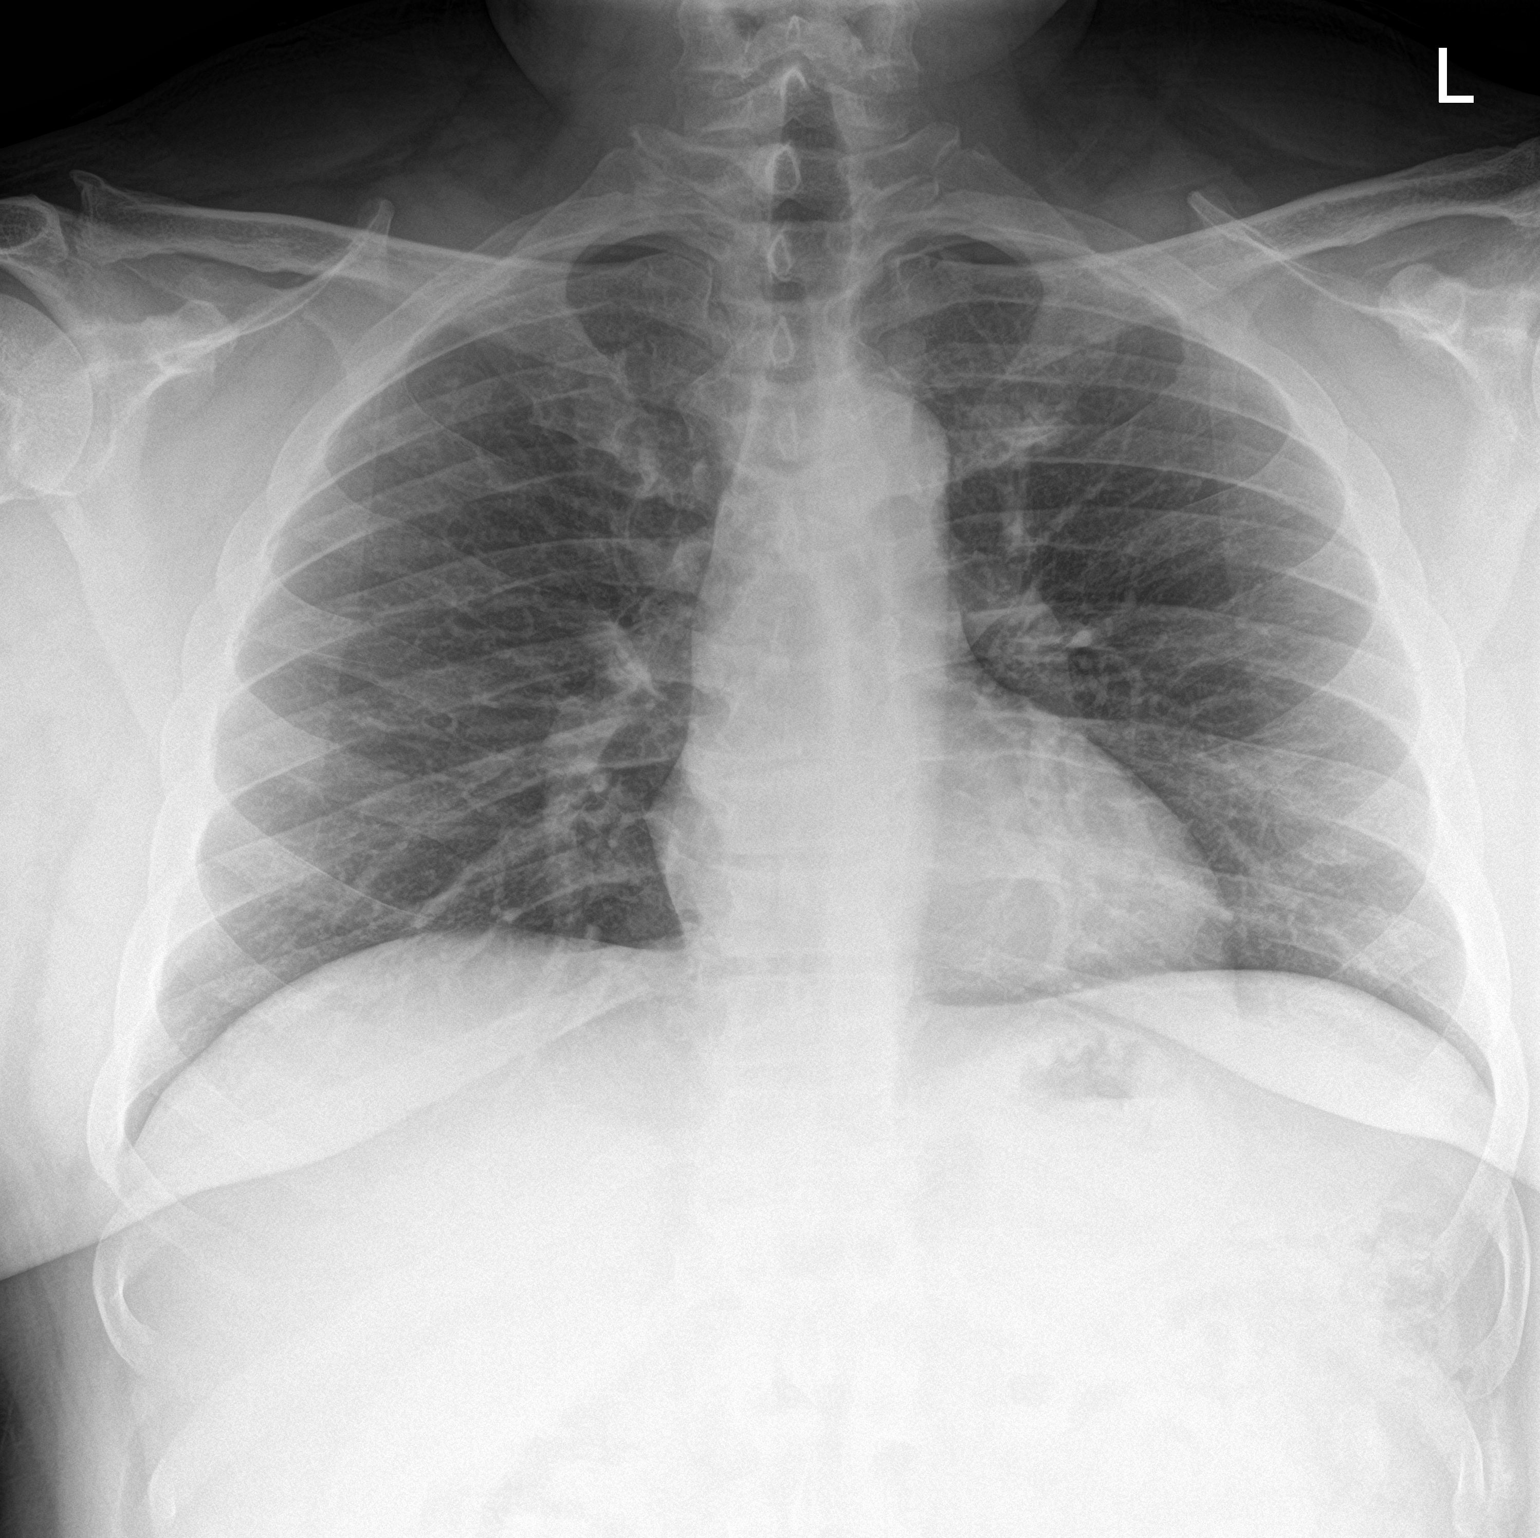

[chest lat]
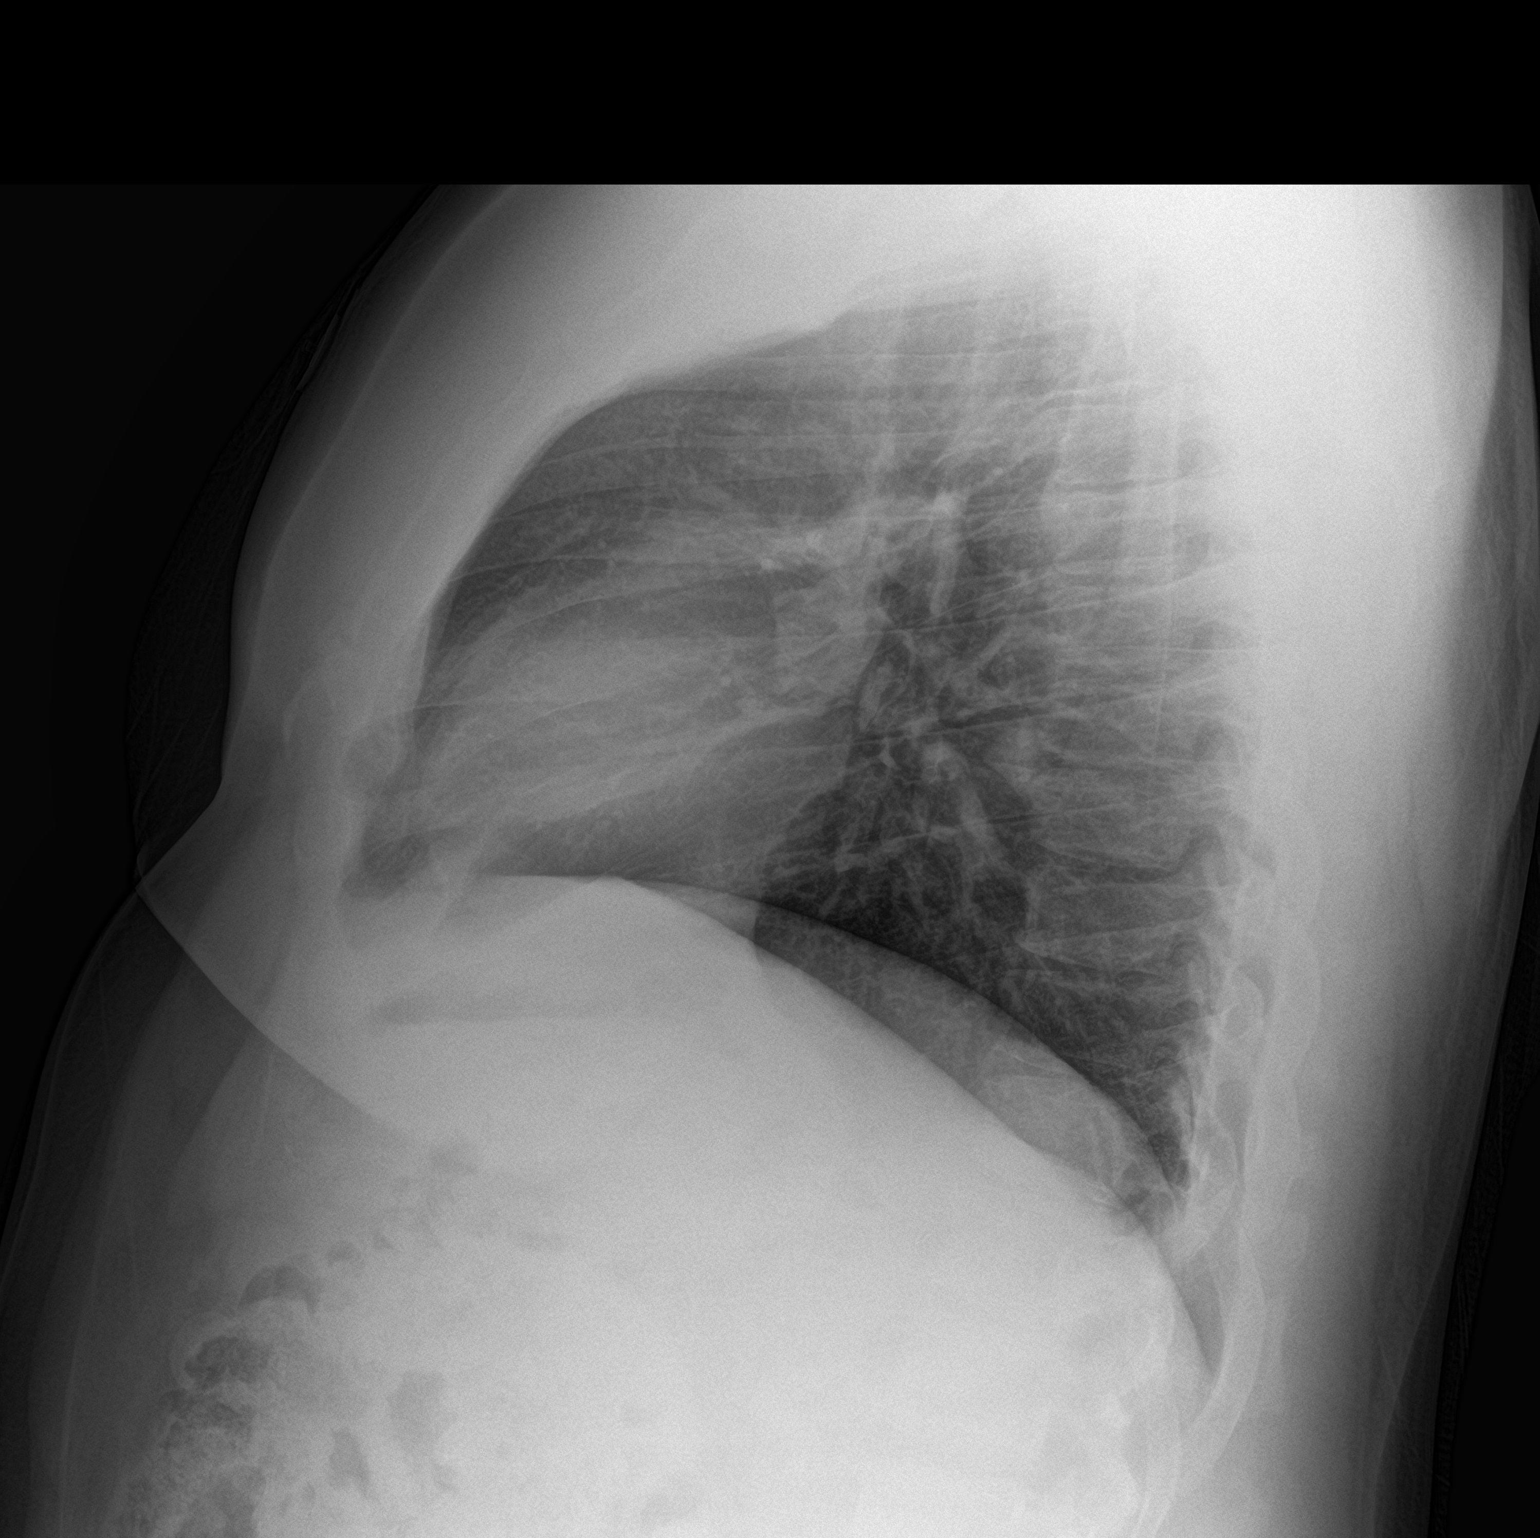

[2 of 2 positions shown; findings below may reference images not displayed]

FINDINGS: Normal heart size. Lungs clear. No pneumothorax. No pleural
effusion.
IMPRESSION: No active cardiopulmonary disease.

## 2019-01-12 ENCOUNTER — Ambulatory Visit: Payer: Self-pay | Admitting: Internal Medicine

## 2019-01-22 MED FILL — metFORMIN HCL 500 MG TABS: 500 | 30 days supply | Qty: 30 | Fill #1

## 2019-02-05 ENCOUNTER — Ambulatory Visit: Payer: Self-pay | Attending: Nurse Practitioner | Admitting: Nurse Practitioner

## 2019-02-05 ENCOUNTER — Encounter: Payer: Self-pay | Admitting: Nurse Practitioner

## 2019-02-05 VITALS — BP 131/89 | HR 88 | Temp 99.4°F | Ht 72.0 in | Wt 292.0 lb

## 2019-02-05 DIAGNOSIS — Z1389 Encounter for screening for other disorder: Secondary | ICD-10-CM

## 2019-02-05 DIAGNOSIS — IMO0001 Reserved for inherently not codable concepts without codable children: Secondary | ICD-10-CM

## 2019-02-05 DIAGNOSIS — L738 Other specified follicular disorders: Secondary | ICD-10-CM

## 2019-02-05 DIAGNOSIS — L739 Follicular disorder, unspecified: Secondary | ICD-10-CM

## 2019-02-05 DIAGNOSIS — E1165 Type 2 diabetes mellitus with hyperglycemia: Secondary | ICD-10-CM

## 2019-02-05 LAB — POCT URINALYSIS DIP (CLINITEK)
Bilirubin, UA: NEGATIVE
Ketones, POC UA: NEGATIVE mg/dL
Leukocytes, UA: NEGATIVE
Nitrite, UA: NEGATIVE
POC PROTEIN,UA: NEGATIVE
SPEC GRAV UA: 1.015 (ref 1.010–1.025)
Urobilinogen, UA: 0.2 E.U./dL
pH, UA: 5 (ref 5.0–8.0)

## 2019-02-05 LAB — GLUCOSE, POCT (MANUAL RESULT ENTRY)
POC GLUCOSE: 315 mg/dL — AB (ref 70–99)
POC GLUCOSE: 269 mg/dL — AB (ref 70–99)

## 2019-02-05 MED ORDER — MUPIROCIN 2 % EX OINT: TOPICAL_OINTMENT | CUTANEOUS | 1 refills | Status: DC

## 2019-02-05 MED ORDER — INSULIN ASPART 100 UNIT/ML ~~LOC~~ SOLN
10.0000 [IU] | Freq: Once | SUBCUTANEOUS | Status: AC
  Administered 2019-02-05: 10 [IU] via SUBCUTANEOUS

## 2019-02-05 MED FILL — MUPIROCIN 2% OINTMENT: 2 | 11 days supply | Qty: 22 | Fill #0

## 2019-02-05 NOTE — Patient Instructions (Signed)

## 2019-02-05 NOTE — Progress Notes (Signed)
Assessment & Plan:  Albert Miller was seen today for sore.  Diagnoses and all orders for this visit:  Folliculitis -     mupirocin ointment (BACTROBAN) 2 %; Apply to affected area 2 times per day.  Uncontrolled type 2 diabetes mellitus without complication, without long-term current use of insulin (HCC) -     Glucose (CBG) -     insulin aspart (novoLOG) injection 10 Units -     POCT URINALYSIS DIP (CLINITEK) -     Hemoglobin A1c Diabetes is poorly controlled. Advised patient to keep a fasting blood sugar log fast, 2 hours post lunch and bedtime which will be reviewed at the next office visit.  Patient has been counseled on age-appropriate routine health concerns for screening and prevention. These are reviewed and up-to-date. Referrals have been placed accordingly. Immunizations are up-to-date or declined.    Subjective:   Chief Complaint  Patient presents with  . Sore    Pt. stated there are bumps on his head with itchiness and soreness.    HPI Albert Miller 48 y.o. male presents to office today with complaints of painful nodules on his scalp.  Folliculitis Onset 1 month ago. 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 He has recently noticed the appearance of small painful "bumps" on his scalp. These areas will also drain when manipulated or with moderate palpation. He reports going to a different barber over the past month as well. Discomfort associated: is painful.  Associated symptoms: none. Denies: fever. Patient has had previous evaluation of similar problem which required surgery (dermatologist).    Review of Systems  Constitutional: Negative for fever, malaise/fatigue and weight loss.  HENT: Negative.  Negative for nosebleeds.   Eyes: Negative.  Negative for blurred vision, double vision and photophobia.  Respiratory: Negative.  Negative for cough and shortness of breath.   Cardiovascular: Negative.  Negative for  chest pain, palpitations and leg swelling.  Gastrointestinal: Negative.  Negative for heartburn, nausea and vomiting.  Musculoskeletal: Negative.  Negative for myalgias.  Skin: Positive for itching.       SEE HPI  Neurological: Negative.  Negative for dizziness, focal weakness, seizures and headaches.  Psychiatric/Behavioral: Negative.  Negative for suicidal ideas.    Past Medical History:  Diagnosis Date  . Hypertension     History reviewed. No pertinent surgical history.  History reviewed. No pertinent family history.  Social History Reviewed with no changes to be made today.   Outpatient Medications Prior to Visit  Medication Sig Dispense Refill  . amLODipine (NORVASC) 5 MG tablet Take 1 tablet (5 mg total) by mouth daily. 90 tablet 3  . aspirin 81 MG EC tablet Take 1 tablet (81 mg total) by mouth daily. 90 tablet 1  . atorvastatin (LIPITOR) 20 MG tablet Take 1 tablet (20 mg total) by mouth daily at 6 PM. 90 tablet 1  . blood glucose meter kit and supplies KIT Dispense based on patient and insurance preference. Use up to four times daily as directed. (FOR ICD-9 250.00, 250.01). 1 each 0  . Blood Glucose Monitoring Suppl (TRUE METRIX METER) w/Device KIT one 1 kit 0  . Blood Glucose Monitoring Suppl DEVI True metrix, lancet 100 each 0  . diclofenac sodium (VOLTAREN) 1 % GEL Apply 2 g topically 4 (four) times daily. 100 g 1  . glucose blood (TRUE METRIX BLOOD GLUCOSE TEST) test strip Use as instructed 100 each 12  . lisinopril-hydrochlorothiazide (PRINZIDE,ZESTORETIC) 20-25 MG tablet  Take 1 tablet by mouth daily. 90 tablet 3  . metFORMIN (GLUCOPHAGE) 500 MG tablet Take 1 tablet (500 mg total) by mouth daily with breakfast. 90 tablet 1  . terbinafine (LAMISIL AT) 1 % cream Apply 1 application topically 2 (two) times daily. 30 g 0   No facility-administered medications prior to visit.     No Known Allergies     Objective:    BP 131/89 (BP Location: Right Arm, Patient Position:  Sitting, Cuff Size: Large)   Pulse 88   Temp 99.4 F (37.4 C) (Oral)   Ht 6' (1.829 m)   Wt 292 lb (132.5 kg)   SpO2 94%   BMI 39.60 kg/m  Wt Readings from Last 3 Encounters:  02/05/19 292 lb (132.5 kg)  09/17/18 (!) 308 lb (139.7 kg)  08/14/18 (!) 300 lb 6.4 oz (136.3 kg)    Physical Exam Vitals signs and nursing note reviewed.  Constitutional:      Appearance: He is well-developed.  HENT:     Head: Normocephalic and atraumatic.     Salivary Glands: Right salivary gland is not diffusely enlarged or tender. Left salivary gland is not diffusely enlarged or tender.      Comments: Raised tender maculopapular nodules Neck:     Musculoskeletal: Normal range of motion.  Cardiovascular:     Rate and Rhythm: Normal rate and regular rhythm.     Heart sounds: Normal heart sounds. No murmur. No friction rub. No gallop.   Pulmonary:     Effort: Pulmonary effort is normal. No tachypnea or respiratory distress.     Breath sounds: Normal breath sounds. No decreased breath sounds, wheezing, rhonchi or rales.  Chest:     Chest wall: No tenderness.  Abdominal:     General: Bowel sounds are normal.     Palpations: Abdomen is soft.  Musculoskeletal: Normal range of motion.  Skin:    General: Skin is warm and dry.     Findings: Lesion present. No rash. Rash is not crusting, nodular, pustular or vesicular.  Neurological:     Mental Status: He is alert and oriented to person, place, and time.     Coordination: Coordination normal.  Psychiatric:        Behavior: Behavior normal. Behavior is cooperative.        Thought Content: Thought content normal.        Judgment: Judgment normal.        Patient has been counseled extensively about nutrition and exercise as well as the importance of adherence with medications and regular follow-up. The patient was given clear instructions to go to ER or return to medical center if symptoms don't improve, worsen or new problems develop. The patient  verbalized understanding.   Follow-up: Return for Follow up with PCP in April for DM and Folliculitis.   Gildardo Pounds, FNP-BC [facility] [place], [place]   02/05/2019, 3:37 PM

## 2019-02-06 LAB — HEMOGLOBIN A1C
Est. average glucose Bld gHb Est-mCnc: 272 mg/dL
Hgb A1c MFr Bld: 11.1 % — ABNORMAL HIGH (ref 4.8–5.6)

## 2019-02-07 ENCOUNTER — Other Ambulatory Visit: Payer: Self-pay | Admitting: Nurse Practitioner

## 2019-02-07 DIAGNOSIS — IMO0001 Reserved for inherently not codable concepts without codable children: Secondary | ICD-10-CM

## 2019-02-07 DIAGNOSIS — E1165 Type 2 diabetes mellitus with hyperglycemia: Principal | ICD-10-CM

## 2019-02-07 MED ORDER — METFORMIN HCL 500 MG PO TABS: 1000.0000 mg | ORAL_TABLET | Freq: Two times a day (BID) | ORAL | 2 refills | Status: DC

## 2019-02-07 MED ORDER — INSULIN DETEMIR 100 UNIT/ML FLEXPEN: 10.0000 [IU] | PEN_INJECTOR | Freq: Every day | SUBCUTANEOUS | 11 refills | Status: DC

## 2019-02-07 MED ORDER — INSULIN PEN NEEDLE 31G X 5 MM MISC: 1 refills | Status: DC

## 2019-02-08 ENCOUNTER — Other Ambulatory Visit: Payer: Self-pay

## 2019-02-08 ENCOUNTER — Telehealth: Payer: Self-pay

## 2019-02-08 MED ORDER — INSULIN PEN NEEDLE 31G X 5 MM MISC: 1 refills | Status: AC

## 2019-02-08 MED FILL — TRUEPLUS PEN NDL 31GX3/16: 31G X 5 MM | 90 days supply | Qty: 100 | Fill #0

## 2019-02-08 MED FILL — !LEVEMIR FLEXPEN 100UNITS/M: 100U/ML (3) | 30 days supply | Qty: 3 | Fill #0

## 2019-02-08 MED FILL — metFORMIN HCL 500 MG TABS: 500 | 30 days supply | Qty: 120 | Fill #0

## 2019-02-08 MED FILL — TRUEPLUS PEN NDL 31GX3/16": 31G X 5 MM | 90 days supply | Qty: 100 | Fill #0

## 2019-02-08 NOTE — Telephone Encounter (Signed)
CMA spoke to patient. Patient is inform of lab results and Rx.  Pt. Verified DOB. Pt. Understood.

## 2019-02-08 NOTE — Telephone Encounter (Signed)
-----   Message from Claiborne Rigg, NP sent at 02/07/2019  8:03 PM EDT ----- A1c has increased from 6.5 to 11.1. You will need to increase your metformin to 2 tablets twice a day and levemir 10 units nightly. Bring your meter and logs for your next appointment in April. I have sent your new medication levemir to the pharmacy.

## 2019-03-05 ENCOUNTER — Ambulatory Visit: Payer: Self-pay | Attending: Internal Medicine | Admitting: Internal Medicine

## 2019-03-05 DIAGNOSIS — Z5329 Procedure and treatment not carried out because of patient's decision for other reasons: Secondary | ICD-10-CM

## 2019-03-05 NOTE — Progress Notes (Signed)
Opened in error. Pt was not available for telephone visit.

## 2019-04-23 ENCOUNTER — Ambulatory Visit: Payer: Medicaid Other | Attending: Primary Care | Admitting: Primary Care

## 2019-04-23 ENCOUNTER — Other Ambulatory Visit: Payer: Self-pay

## 2019-04-23 ENCOUNTER — Encounter: Payer: Self-pay | Admitting: Primary Care

## 2019-04-23 DIAGNOSIS — L739 Follicular disorder, unspecified: Secondary | ICD-10-CM

## 2019-04-23 DIAGNOSIS — E1100 Type 2 diabetes mellitus with hyperosmolarity without nonketotic hyperglycemic-hyperosmolar coma (NKHHC): Secondary | ICD-10-CM

## 2019-04-23 DIAGNOSIS — Z6841 Body Mass Index (BMI) 40.0 and over, adult: Secondary | ICD-10-CM

## 2019-04-23 DIAGNOSIS — L738 Other specified follicular disorders: Secondary | ICD-10-CM

## 2019-04-23 DIAGNOSIS — I10 Essential (primary) hypertension: Secondary | ICD-10-CM

## 2019-04-23 MED ORDER — LISINOPRIL-HYDROCHLOROTHIAZIDE 20-25 MG PO TABS: 1.0000 | ORAL_TABLET | Freq: Every day | ORAL | 0 refills | Status: DC

## 2019-04-23 MED ORDER — DOXYCYCLINE HYCLATE 100 MG PO TABS: 100.0000 mg | ORAL_TABLET | Freq: Two times a day (BID) | ORAL | 0 refills | Status: DC

## 2019-04-23 MED FILL — DOXYCYCLINE HYCLATE 100 MG: 100 | 10 days supply | Qty: 20 | Fill #0

## 2019-04-23 MED FILL — LISINOPRIL-HCTZ 20-25 MG TA: 20-25 | 30 days supply | Qty: 30 | Fill #0

## 2019-04-23 NOTE — Progress Notes (Signed)
Acute Office Visit    Subjective:    Patient ID: Albert Miller, male    DOB: 08-07-1971, 48 y.o.   MRN: [id number]  No chief complaint on file.   HPI Patient is in today for Mr. Albert Miller is concerned with a nodule on the top head previously tx with Bactroban with little affect, Now the area has enlarged and painful.   Past Medical History:  Diagnosis Date  . Hypertension     History reviewed. No pertinent surgical history.  History reviewed. No pertinent family history.  Social History   Socioeconomic History  . Marital status: Single    Spouse name: Not on file  . Number of children: Not on file  . Years of education: Not on file  . Highest education level: Not on file  Occupational History  . Not on file  Social Needs  . Financial resource strain: Not on file  . Food insecurity    Worry: Not on file    Inability: Not on file  . Transportation needs    Medical: Not on file    Non-medical: Not on file  Tobacco Use  . Smoking status: Former Research scientist (life sciences)  . Smokeless tobacco: Never Used  Substance and Sexual Activity  . Alcohol use: No  . Drug use: No  . Sexual activity: Not Currently  Lifestyle  . Physical activity    Days per week: Not on file    Minutes per session: Not on file  . Stress: Not on file  Relationships  . Social Herbalist on phone: Not on file    Gets together: Not on file    Attends religious service: Not on file    Active member of club or organization: Not on file    Attends meetings of clubs or organizations: Not on file    Relationship status: Not on file  . Intimate partner violence    Fear of current or ex partner: Not on file    Emotionally abused: Not on file    Physically abused: Not on file    Forced sexual activity: Not on file  Other Topics Concern  . Not on file  Social History Narrative  . Not on file    Outpatient Medications Prior to Visit  Medication Sig Dispense Refill  . amLODipine (NORVASC) 5 MG  tablet Take 1 tablet (5 mg total) by mouth daily. 90 tablet 3  . aspirin 81 MG EC tablet Take 1 tablet (81 mg total) by mouth daily. 90 tablet 1  . atorvastatin (LIPITOR) 20 MG tablet Take 1 tablet (20 mg total) by mouth daily at 6 PM. 90 tablet 1  . blood glucose meter kit and supplies KIT Dispense based on patient and insurance preference. Use up to four times daily as directed. (FOR ICD-9 250.00, 250.01). 1 each 0  . Blood Glucose Monitoring Suppl (TRUE METRIX METER) w/Device KIT one 1 kit 0  . Blood Glucose Monitoring Suppl DEVI True metrix, lancet 100 each 0  . diclofenac sodium (VOLTAREN) 1 % GEL Apply 2 g topically 4 (four) times daily. 100 g 1  . glucose blood (TRUE METRIX BLOOD GLUCOSE TEST) test strip Use as instructed 100 each 12  . Insulin Detemir (LEVEMIR) 100 UNIT/ML Pen Inject 10 Units into the skin at bedtime for 30 days. 15 mL 11  . Insulin Pen Needle (B-D UF III MINI PEN NEEDLES) 31G X 5 MM MISC Use as instructed. Inject into the skin once nightly. 100  each 1  . metFORMIN (GLUCOPHAGE) 500 MG tablet Take 2 tablets (1,000 mg total) by mouth 2 (two) times daily with a meal for 30 days. 120 tablet 2  . mupirocin ointment (BACTROBAN) 2 % Apply to affected area 2 times per day. 30 g 1  . terbinafine (LAMISIL AT) 1 % cream Apply 1 application topically 2 (two) times daily. 30 g 0  . lisinopril-hydrochlorothiazide (PRINZIDE,ZESTORETIC) 20-25 MG tablet Take 1 tablet by mouth daily. 90 tablet 3   No facility-administered medications prior to visit.     No Known Allergies  Review of Systems  Constitutional: Negative.   HENT: Negative.   Eyes: Positive for blurred vision.  Respiratory: Negative.   Cardiovascular: Negative.   Gastrointestinal: Negative.   Genitourinary: Negative.   Musculoskeletal: Negative.   Skin:       See picture  Neurological: Negative.   Endo/Heme/Allergies: Negative.   Psychiatric/Behavioral: Negative.        Objective:    Physical Exam   Constitutional: He is oriented to person, place, and time. He appears well-developed and well-nourished.  HENT:  Head: Normocephalic and atraumatic.  Eyes: EOM are normal.  Neck: Normal range of motion. Neck supple.  Cardiovascular: Normal rate and regular rhythm.  Pulmonary/Chest: Effort normal and breath sounds normal.  Abdominal: Soft. Bowel sounds are normal. He exhibits distension.  Musculoskeletal: Normal range of motion.  Neurological: He is oriented to person, place, and time.  Skin: Skin is warm.  Psychiatric: He has a normal mood and affect.    There were no vitals taken for this visit. Wt Readings from Last 3 Encounters:  02/05/19 292 lb (132.5 kg)  09/17/18 (!) 308 lb (139.7 kg)  08/14/18 (!) 300 lb 6.4 oz (136.3 kg)    Health Maintenance Due  Topic Date Due  . OPHTHALMOLOGY EXAM  05/01/2018    There are no preventive care reminders to display for this patient.   Lab Results  Component Value Date   TSH 1.170 04/21/2017   Lab Results  Component Value Date   WBC 5.1 06/16/2018   HGB 14.3 06/16/2018   HCT 41.8 06/16/2018   MCV 92 06/16/2018   PLT 270 06/16/2018   Lab Results  Component Value Date   NA 142 06/16/2018   K 4.3 06/16/2018   CO2 22 06/16/2018   GLUCOSE 129 (H) 06/16/2018   BUN 13 06/16/2018   CREATININE 1.06 06/16/2018   BILITOT 0.4 06/16/2018   ALKPHOS 92 06/16/2018   AST 26 06/16/2018   ALT 35 06/16/2018   PROT 7.5 06/16/2018   ALBUMIN 4.3 06/16/2018   CALCIUM 9.5 06/16/2018   ANIONGAP 7 04/21/2017   Lab Results  Component Value Date   CHOL 151 06/16/2018   Lab Results  Component Value Date   HDL 57 06/16/2018   Lab Results  Component Value Date   LDLCALC 80 06/16/2018   Lab Results  Component Value Date   TRIG 69 06/16/2018   Lab Results  Component Value Date   CHOLHDL 2.6 06/16/2018   Lab Results  Component Value Date   HGBA1C 11.1 (H) 02/05/2019       Assessment & Plan:   Problem List Items Addressed This  Visit    Uncontrolled type 2 DM with hyperosmolar nonketotic hyperglycemia (HCC)   Relevant Medications   lisinopril-hydrochlorothiazide (ZESTORETIC) 20-25 MG tablet   Other Relevant Orders   Ambulatory referral to Ophthalmology   Glucose (CBG)   HTN (hypertension)   Relevant Medications  lisinopril-hydrochlorothiazide (ZESTORETIC) 20-25 MG tablet   Class 3 severe obesity due to excess calories with serious comorbidity and body mass index (BMI) of 40.0 to 44.9 in adult [facility])    Other Visit Diagnoses    Folliculitis    -  Primary   Relevant Orders   Ambulatory referral to Dermatology    Diagnoses and all orders for this visit:  Folliculitis Unable to obtain culture no pus . Yesterday patient stated puss ooze out with a warm cloth -     Ambulatory referral to Dermatology  Essential hypertension Counseled on blood pressure goal of less than 130/80, low-sodium, DASH diet, medication compliance, 150 minutes of moderate intensity exercise per week. Discussed medication compliance, adverse effects. -     lisinopril-hydrochlorothiazide (ZESTORETIC) 20-25 MG tablet; Take 1 tablet by mouth daily.  Class 3 severe obesity due to excess calories with serious comorbidity and body mass index (BMI) of 40.0 to 44.9 in adult [facility]) Until COVID patient was trying to exercise and diet modification but with this ongoing pandemic he has not been able to exercise as prior.  Uncontrolled type 2 DM with hyperosmolar nonketotic hyperglycemia (HCC) A1C ADA recommends the following therapeutic goals for glycemic control related to A1c measurements: Goal of therapy: Less than 6.5 hemoglobin A1c.  Reference clinical practice recommendations. Foods that are high in carbohydrates are the following rice, potatoes, breads, sugars, and pastas.  Reduction in the intake (eating) will assist in lowering your blood sugars. Cont current DM meds f/u 1 months for labs -     Ambulatory referral to Ophthalmology -      Glucose (CBG) 238   Other orders -     doxycycline (VIBRA-TABS) 100 MG tablet; Take 1 tablet (100 mg total) by mouth 2 (two) times daily.     Meds ordered this encounter  Medications  . doxycycline (VIBRA-TABS) 100 MG tablet    Sig: Take 1 tablet (100 mg total) by mouth 2 (two) times daily.    Dispense:  20 tablet    Refill:  0  . lisinopril-hydrochlorothiazide (ZESTORETIC) 20-25 MG tablet    Sig: Take 1 tablet by mouth daily.    Dispense:  90 tablet    Refill:  0     Kerin Perna, NP

## 2019-07-15 MED FILL — metFORMIN HCL 500 MG TABS: 500 | 30 days supply | Qty: 120 | Fill #1

## 2019-07-15 MED FILL — MUPIROCIN 2% OINTMENT: 2 | 11 days supply | Qty: 22 | Fill #1

## 2019-08-23 ENCOUNTER — Ambulatory Visit: Payer: Medicaid Other | Admitting: Internal Medicine

## 2019-09-24 ENCOUNTER — Ambulatory Visit: Payer: Self-pay | Attending: Internal Medicine | Admitting: Pharmacist

## 2019-09-24 ENCOUNTER — Ambulatory Visit: Payer: Self-pay | Attending: Internal Medicine | Admitting: Internal Medicine

## 2019-09-24 ENCOUNTER — Encounter: Payer: Self-pay | Admitting: Internal Medicine

## 2019-09-24 ENCOUNTER — Other Ambulatory Visit: Payer: Self-pay

## 2019-09-24 VITALS — BP 150/120 | HR 82 | Temp 98.3°F | Resp 16 | Wt 284.0 lb

## 2019-09-24 DIAGNOSIS — Z23 Encounter for immunization: Secondary | ICD-10-CM

## 2019-09-24 DIAGNOSIS — E1100 Type 2 diabetes mellitus with hyperosmolarity without nonketotic hyperglycemic-hyperosmolar coma (NKHHC): Secondary | ICD-10-CM

## 2019-09-24 DIAGNOSIS — E785 Hyperlipidemia, unspecified: Secondary | ICD-10-CM

## 2019-09-24 DIAGNOSIS — I10 Essential (primary) hypertension: Secondary | ICD-10-CM

## 2019-09-24 DIAGNOSIS — E1169 Type 2 diabetes mellitus with other specified complication: Secondary | ICD-10-CM | POA: Insufficient documentation

## 2019-09-24 DIAGNOSIS — Z6838 Body mass index (BMI) 38.0-38.9, adult: Secondary | ICD-10-CM

## 2019-09-24 LAB — POCT GLYCOSYLATED HEMOGLOBIN (HGB A1C): HbA1c, POC (controlled diabetic range): 12.6 % — AB (ref 0.0–7.0)

## 2019-09-24 LAB — GLUCOSE, POCT (MANUAL RESULT ENTRY): POC Glucose: 241 mg/dl — AB (ref 70–99)

## 2019-09-24 MED ORDER — ATORVASTATIN CALCIUM 20 MG PO TABS: 20.0000 mg | ORAL_TABLET | Freq: Every day | ORAL | 1 refills | Status: DC

## 2019-09-24 MED ORDER — AMLODIPINE BESYLATE 5 MG PO TABS: 5.0000 mg | ORAL_TABLET | Freq: Every day | ORAL | 1 refills | Status: DC

## 2019-09-24 MED ORDER — LISINOPRIL-HYDROCHLOROTHIAZIDE 20-25 MG PO TABS: 1.0000 | ORAL_TABLET | Freq: Every day | ORAL | 1 refills | Status: DC

## 2019-09-24 MED ORDER — INSULIN DETEMIR 100 UNIT/ML FLEXPEN: 10.0000 [IU] | PEN_INJECTOR | Freq: Every day | SUBCUTANEOUS | 11 refills | Status: DC

## 2019-09-24 MED ORDER — METFORMIN HCL 1000 MG PO TABS: 1000.0000 mg | ORAL_TABLET | Freq: Two times a day (BID) | ORAL | 3 refills | Status: DC

## 2019-09-24 MED FILL — metFORMIN HCL 1000 MG TABS: 1000 | 30 days supply | Qty: 60 | Fill #0

## 2019-09-24 MED FILL — LISINOPRIL-HCTZ 20-25 MG TA: 20-25 | 30 days supply | Qty: 30 | Fill #0

## 2019-09-24 MED FILL — ATORVASTATIN CALCIUM 20 MG: 20 | 30 days supply | Qty: 30 | Fill #0

## 2019-09-24 MED FILL — AMLODIPINE BESYLATE 5 MG TA: 5 | 30 days supply | Qty: 30 | Fill #0

## 2019-09-24 NOTE — Progress Notes (Signed)
Patient ID: Albert Miller, male    DOB: 21-Sep-1971  MRN: [id number]  CC: Hypertension   Subjective: Albert Miller is a 48 y.o. male who presents for chronic ds management. Last seen by me 1 yr ago His concerns today include:  Pt with hx of DM, HTN and HL.  HYPERTENSION Currently taking: see medication list Med Adherence: _0  Yes -Lis/HCTZ and Norvasc but did not take as yet for this morning   _1   Medication side effects: _2  Yes    _3  No Adherence with salt restriction: _4  Yes    _5  No Home Monitoring?: _6  Yes occasionally    Monitoring Frequency: _7  Yes    _8  No Home BP results range: last wk SBP was 130-140. SOB? _9  Yes    _10  No Chest Pain?: _11  Yes    _12  No Leg swelling?: _13  Yes    _14  No Headaches?: _15  Yes    _16  No Dizziness? _17  Yes    _18  No Comments:   DIABETES TYPE 2 Last A1C:   Results for orders placed or performed in visit on 09/24/19  POCT glucose (manual entry)  Result Value Ref Range   POC Glucose 241 (A) 70 - 99 mg/dl  POCT glycosylated hemoglobin (Hb A1C)  Result Value Ref Range   Hemoglobin A1C     HbA1c POC (<> result, manual entry)     HbA1c, POC (prediabetic range)     HbA1c, POC (controlled diabetic range) 12.6 (A) 0.0 - 7.0 %    Med Adherence:  _19  Yes    _20  No -not taking Metformin and Levemir consistently.  States that he has just been lazy and has gotten off course Medication side effects:  _21  Yes    _22  No Home Monitoring?  _23  Yes - about once a wk.  Range 200-300s  _24  No Home glucose results range: Diet Adherence: _25  Yes    _26  No - eating "on the run.  Just being lazy." Stays up late at nights eating snacks.  Plans to start cooking  Exercise: _27  Yes    _28  No - "not as I should." Hypoglycemic episodes?: _29  Yes    _30  No Numbness of the feet? _31  Yes    _32  No Retinopathy hx? _33  Yes    _34  No Last eye exam:  No blurred vision.  Over due for eye exam.  Comments:   HL:  Not taking Lipitor consistently   Patient Active  Problem List   Diagnosis Date Noted  . Class 3 severe obesity due to excess calories with serious comorbidity and body mass index (BMI) of 40.0 to 44.9 in adult ([place]) 09/17/2018  . High triglycerides 04/23/2017  . Uncontrolled type 2 DM with hyperosmolar nonketotic hyperglycemia ([place]) 04/19/2017  . HTN (hypertension) 04/19/2017     Current Outpatient Medications on File Prior to Visit  Medication Sig Dispense Refill  . aspirin 81 MG EC tablet Take 1 tablet (81 mg total) by mouth daily. 90 tablet 1  . blood glucose meter kit and supplies KIT Dispense based on patient and insurance preference. Use up to four times daily as directed. (FOR ICD-9 250.00, 250.01). 1 each 0  . Blood Glucose Monitoring Suppl (TRUE METRIX METER) w/Device KIT one 1 kit 0  . Blood Glucose Monitoring Suppl DEVI True metrix, lancet 100 each 0  . diclofenac sodium (VOLTAREN) 1 % GEL Apply 2 g topically 4 (four) times daily. 100 g 1  . glucose blood (TRUE METRIX BLOOD GLUCOSE TEST)  test strip Use as instructed 100 each 12  . Insulin Pen Needle (B-D UF III MINI PEN NEEDLES) 31G X 5 MM MISC Use as instructed. Inject into the skin once nightly. 100 each 1  . mupirocin ointment (BACTROBAN) 2 % Apply to affected area 2 times per day. 30 g 1  . terbinafine (LAMISIL AT) 1 % cream Apply 1 application topically 2 (two) times daily. 30 g 0   No current facility-administered medications on file prior to visit.     No Known Allergies  Social History   Socioeconomic History  . Marital status: Single    Spouse name: Not on file  . Number of children: Not on file  . Years of education: Not on file  . Highest education level: Not on file  Occupational History  . Not on file  Social Needs  . Financial resource strain: Not on file  . Food insecurity    Worry: Not on file    Inability: Not on file  . Transportation needs    Medical: Not on file    Non-medical: Not on file  Tobacco Use  . Smoking status: Former Research scientist (life sciences)  .  Smokeless tobacco: Never Used  Substance and Sexual Activity  . Alcohol use: No  . Drug use: No  . Sexual activity: Not Currently  Lifestyle  . Physical activity    Days per week: Not on file    Minutes per session: Not on file  . Stress: Not on file  Relationships  . Social Herbalist on phone: Not on file    Gets together: Not on file    Attends religious service: Not on file    Active member of club or organization: Not on file    Attends meetings of clubs or organizations: Not on file    Relationship status: Not on file  . Intimate partner violence    Fear of current or ex partner: Not on file    Emotionally abused: Not on file    Physically abused: Not on file    Forced sexual activity: Not on file  Other Topics Concern  . Not on file  Social History Narrative  . Not on file    No family history on file.  No past surgical history on file.  ROS: Review of Systems Negative except as stated above  PHYSICAL EXAM: BP (!) 150/120   Pulse 82   Temp 98.3 F (36.8 C) (Oral)   Resp 16   Wt 284 lb (128.8 kg)   SpO2 96%   BMI 38.52 kg/m   Wt Readings from Last 3 Encounters:  09/24/19 284 lb (128.8 kg)  02/05/19 292 lb (132.5 kg)  09/17/18 (!) 308 lb (139.7 kg)    Physical Exam General appearance - alert, well appearing, and in no distress Mental status - normal mood, behavior, speech, dress, motor activity, and thought processes Eyes - pupils equal and reactive, extraocular eye movements intact Neck - supple, no significant adenopathy.  No thyroid enlargement or nodules.  No carotid bruits. Chest - clear to auscultation, no wheezes, rales or rhonchi, symmetric air entry Heart - normal rate, regular rhythm, normal S1, S2, no murmurs, rubs, clicks or gallops Extremities - peripheral pulses normal, no pedal edema, no clubbing or cyanosis Diabetic Foot Exam - Simple   Simple Foot Form Visual Inspection See comments: Yes Sensation Testing Intact to  touch and monofilament testing bilaterally: Yes Pulse Check Posterior Tibialis and Dorsalis pulse intact bilaterally:  Yes Comments Toenails on the big toe are discolored and chipped.     CMP Latest Ref Rng & Units 06/16/2018 04/21/2017 04/20/2017  Glucose 65 - 99 mg/dL 129(H) 250(H) 135(H)  BUN 6 - 24 mg/dL _0 Creatinine 0.76 - 1.27 mg/dL 1.06 0.97 1.15  Sodium 134 - 144 mmol/L 142 136 140  Potassium 3.5 - 5.2 mmol/L 4.3 3.6 3.5  Chloride 96 - 106 mmol/L 105 103 103  CO2 20 - 29 mmol/L _1 Calcium 8.7 - 10.2 mg/dL 9.5 8.7(L) 9.1  Total Protein 6.0 - 8.5 g/dL 7.5 6.8 -  Total Bilirubin 0.0 - 1.2 mg/dL 0.4 0.8 -  Alkaline Phos 39 - 117 IU/L 92 91 -  AST 0 - 40 IU/L 26 39 -  ALT 0 - 44 IU/L 35 63 -   Lipid Panel     Component Value Date/Time   CHOL 151 06/16/2018 1105   TRIG 69 06/16/2018 1105   HDL 57 06/16/2018 1105   CHOLHDL 2.6 06/16/2018 1105   CHOLHDL 4.6 04/20/2017 0325   VLDL 37 04/20/2017 0325   LDLCALC 80 06/16/2018 1105    CBC    Component Value Date/Time   WBC 5.1 06/16/2018 1105   WBC 7.0 04/20/2017 0325   RBC 4.55 06/16/2018 1105   RBC 4.48 04/20/2017 0325   HGB 14.3 06/16/2018 1105   HCT 41.8 06/16/2018 1105   PLT 270 06/16/2018 1105   MCV 92 06/16/2018 1105   MCH 31.4 06/16/2018 1105   MCH 30.8 04/20/2017 0325   MCHC 34.2 06/16/2018 1105   MCHC 34.2 04/20/2017 0325   RDW 13.5 06/16/2018 1105    ASSESSMENT AND PLAN: 1. Uncontrolled type 2 DM with hyperosmolar nonketotic hyperglycemia (HCC) Discussed the importance of healthy eating habits, regular aerobic exercise (at least 150 minutes a week as tolerated) and medication compliance to achieve or maintain control of diabetes. -Patient has set a goal to restart Metformin and Levemir and take consistently. Dietary counseling given.  He will try to do a better job with meal planning so that he is not eating fast foods all the time - Microalbumin / creatinine urine ratio - POCT glucose  (manual entry) - POCT glycosylated hemoglobin (Hb A1C) - CBC - Comprehensive metabolic panel - Lipid panel - metFORMIN (GLUCOPHAGE) 1000 MG tablet; Take 1 tablet (1,000 mg total) by mouth 2 (two) times daily with a meal.  Dispense: 180 tablet; Refill: 3 - Insulin Detemir (LEVEMIR) 100 UNIT/ML Pen; Inject 10 Units into the skin at bedtime.  Dispense: 15 mL; Refill: 11  2. Essential hypertension Not at goal.  He has not taken meds as yet for today.  Discussed health risks associated with uncontrolled blood pressure.  He plans to do better with taking medicines consistently -Appointment with clinical pharmacist in 2 weeks for recheck blood pressure. - lisinopril-hydrochlorothiazide (ZESTORETIC) 20-25 MG tablet; Take 1 tablet by mouth daily.  Dispense: 90 tablet; Refill: 1 - amLODipine (NORVASC) 5 MG tablet; Take 1 tablet (5 mg total) by mouth daily.  Dispense: 90 tablet; Refill: 1  3. Hyperlipidemia associated with type 2 diabetes mellitus (HCC) - atorvastatin (LIPITOR) 20 MG tablet; Take 1 tablet (20 mg total) by mouth daily at 6 PM.  Dispense: 90 tablet; Refill: 1  4. Class 2 severe obesity due to excess calories with serious comorbidity and body mass index (BMI) of 38.0 to 38.9 in adult [facility]) See #1 above  5. Need for influenza vaccination Given  Patient was given the opportunity to ask questions.  Patient verbalized understanding of the plan and was able to repeat key elements of the plan.   Orders Placed This Encounter  Procedures  . Microalbumin / creatinine urine ratio  . CBC  . Comprehensive metabolic panel  . Lipid panel  . POCT glucose (manual entry)  . POCT glycosylated hemoglobin (Hb A1C)     Requested Prescriptions   Signed Prescriptions Disp Refills  . lisinopril-hydrochlorothiazide (ZESTORETIC) 20-25 MG tablet 90 tablet 1    Sig: Take 1 tablet by mouth daily.  . metFORMIN (GLUCOPHAGE) 1000 MG tablet 180 tablet 3    Sig: Take 1 tablet (1,000 mg total) by mouth  2 (two) times daily with a meal.  . Insulin Detemir (LEVEMIR) 100 UNIT/ML Pen 15 mL 11    Sig: Inject 10 Units into the skin at bedtime.  Marland Kitchen atorvastatin (LIPITOR) 20 MG tablet 90 tablet 1    Sig: Take 1 tablet (20 mg total) by mouth daily at 6 PM.  . amLODipine (NORVASC) 5 MG tablet 90 tablet 1    Sig: Take 1 tablet (5 mg total) by mouth daily.    Return in about 2 months (around 11/24/2019).  Karle Plumber, MD, FACP

## 2019-09-24 NOTE — Patient Instructions (Addendum)
Please give patient an appointment with the clinical pharmacist in 1 week for repeat blood pressure check.  Influenza Virus Vaccine injection (Fluarix) What is this medicine? INFLUENZA VIRUS VACCINE (in floo EN zuh VAHY ruhs vak SEEN) helps to reduce the risk of getting influenza also known as the flu. This medicine may be used for other purposes; ask your health care provider or pharmacist if you have questions. COMMON BRAND NAME(S): Fluarix, Fluzone What should I tell my health care provider before I take this medicine? They need to know if you have any of these conditions:  bleeding disorder like hemophilia  fever or infection  Guillain-Barre syndrome or other neurological problems  immune system problems  infection with the human immunodeficiency virus (HIV) or AIDS  low blood platelet counts  multiple sclerosis  an unusual or allergic reaction to influenza virus vaccine, eggs, chicken proteins, latex, gentamicin, other medicines, foods, dyes or preservatives  pregnant or trying to get pregnant  breast-feeding How should I use this medicine? This vaccine is for injection into a muscle. It is given by a health care professional. A copy of Vaccine Information Statements will be given before each vaccination. Read this sheet carefully each time. The sheet may change frequently. Talk to your pediatrician regarding the use of this medicine in children. Special care may be needed. Overdosage: If you think you have taken too much of this medicine contact a poison control center or emergency room at once. NOTE: This medicine is only for you. Do not share this medicine with others. What if I miss a dose? This does not apply. What may interact with this medicine?  chemotherapy or radiation therapy  medicines that lower your immune system like etanercept, anakinra, infliximab, and adalimumab  medicines that treat or prevent blood clots like warfarin  phenytoin  steroid  medicines like prednisone or cortisone  theophylline  vaccines This list may not describe all possible interactions. Give your health care provider a list of all the medicines, herbs, non-prescription drugs, or dietary supplements you use. Also tell them if you smoke, drink alcohol, or use illegal drugs. Some items may interact with your medicine. What should I watch for while using this medicine? Report any side effects that do not go away within 3 days to your doctor or health care professional. Call your health care provider if any unusual symptoms occur within 6 weeks of receiving this vaccine. You may still catch the flu, but the illness is not usually as bad. You cannot get the flu from the vaccine. The vaccine will not protect against colds or other illnesses that may cause fever. The vaccine is needed every year. What side effects may I notice from receiving this medicine? Side effects that you should report to your doctor or health care professional as soon as possible:  allergic reactions like skin rash, itching or hives, swelling of the face, lips, or tongue Side effects that usually do not require medical attention (report to your doctor or health care professional if they continue or are bothersome):  fever  headache  muscle aches and pains  pain, tenderness, redness, or swelling at site where injected  weak or tired This list may not describe all possible side effects. Call your doctor for medical advice about side effects. You may report side effects to FDA at 1-800-FDA-1088. Where should I keep my medicine? This vaccine is only given in a clinic, pharmacy, doctor's office, or other health care setting and will not be stored at  home. NOTE: This sheet is a summary. It may not cover all possible information. If you have questions about this medicine, talk to your doctor, pharmacist, or health care provider.  2020 Elsevier/Gold Standard (2008-06-15 09:30:40)

## 2019-09-24 NOTE — Progress Notes (Signed)
Patient presents for vaccination against influenza per orders of Dr. Johnson. Consent given. Counseling provided. No contraindications exists. Vaccine administered without incident.   

## 2019-09-25 LAB — CBC
Hematocrit: 44.6 % (ref 37.5–51.0)
Hemoglobin: 14.9 g/dL (ref 13.0–17.7)
MCH: 30.2 pg (ref 26.6–33.0)
MCHC: 33.4 g/dL (ref 31.5–35.7)
MCV: 91 fL (ref 79–97)
Platelets: 258 10*3/uL (ref 150–450)
RBC: 4.93 x10E6/uL (ref 4.14–5.80)
RDW: 12.8 % (ref 11.6–15.4)
WBC: 5 10*3/uL (ref 3.4–10.8)

## 2019-09-25 LAB — COMPREHENSIVE METABOLIC PANEL
ALT: 35 IU/L (ref 0–44)
AST: 25 IU/L (ref 0–40)
Albumin/Globulin Ratio: 1.3 (ref 1.2–2.2)
Albumin: 4.3 g/dL (ref 4.0–5.0)
Alkaline Phosphatase: 117 IU/L (ref 39–117)
BUN/Creatinine Ratio: 13 (ref 9–20)
BUN: 15 mg/dL (ref 6–24)
Bilirubin Total: 0.4 mg/dL (ref 0.0–1.2)
CO2: 24 mmol/L (ref 20–29)
Calcium: 10 mg/dL (ref 8.7–10.2)
Chloride: 100 mmol/L (ref 96–106)
Creatinine, Ser: 1.15 mg/dL (ref 0.76–1.27)
GFR calc Af Amer: 87 mL/min/{1.73_m2} (ref >59)
GFR calc non Af Amer: 75 mL/min/{1.73_m2} (ref >59)
Globulin, Total: 3.4 g/dL (ref 1.5–4.5)
Glucose: 237 mg/dL — ABNORMAL HIGH (ref 65–99)
Potassium: 4.4 mmol/L (ref 3.5–5.2)
Sodium: 137 mmol/L (ref 134–144)
Total Protein: 7.7 g/dL (ref 6.0–8.5)

## 2019-09-25 LAB — MICROALBUMIN / CREATININE URINE RATIO
Creatinine, Urine: 175.6 mg/dL
Microalb/Creat Ratio: 11 mg/g creat (ref 0–29)
Microalbumin, Urine: 18.9 ug/mL

## 2019-09-25 LAB — LIPID PANEL
Chol/HDL Ratio: 3.6 ratio (ref 0.0–5.0)
Cholesterol, Total: 196 mg/dL (ref 100–199)
HDL: 54 mg/dL (ref >39)
LDL Chol Calc (NIH): 120 mg/dL — ABNORMAL HIGH (ref 0–99)
Triglycerides: 125 mg/dL (ref 0–149)
VLDL Cholesterol Cal: 22 mg/dL (ref 5–40)

## 2019-09-28 ENCOUNTER — Telehealth: Payer: Self-pay

## 2019-09-28 NOTE — Telephone Encounter (Signed)
Contacted pt to go over lab results pt is aware and doesn't have any questions or concerns 

## 2019-10-01 ENCOUNTER — Ambulatory Visit: Payer: Self-pay | Attending: Family Medicine | Admitting: Pharmacist

## 2019-10-01 ENCOUNTER — Other Ambulatory Visit: Payer: Self-pay

## 2019-10-01 ENCOUNTER — Encounter: Payer: Self-pay | Admitting: Pharmacist

## 2019-10-01 ENCOUNTER — Other Ambulatory Visit: Payer: Self-pay | Admitting: Pharmacist

## 2019-10-01 VITALS — BP 134/90 | HR 83

## 2019-10-01 DIAGNOSIS — I10 Essential (primary) hypertension: Secondary | ICD-10-CM

## 2019-10-01 MED ORDER — BASAGLAR KWIKPEN 100 UNIT/ML ~~LOC~~ SOPN: 10.0000 [IU] | PEN_INJECTOR | Freq: Every day | SUBCUTANEOUS | 2 refills | Status: AC

## 2019-10-01 MED FILL — !BASAGLAR 100 UNIT/ML KWIK: 100 | 30 days supply | Qty: 3 | Fill #0

## 2019-10-01 NOTE — Progress Notes (Signed)
   S:    PCP: Dr. Wynetta Emery Patient arrives in good spirits. Presents to the clinic for BP check. Patient was referred and last seen by Primary Care Provider on 09/24/19. 150/120 mmHg.  Patient denies adherence with medications. He reports trying to improve this within the last several days. He has taken them this morning.   Current BP Medications include:  Amlodipine 5 mg daily, Zestoretic 20-25 mg daily  Dietary habits include: reports limiting salt; denies drinking caffeine  Exercise habits include: was jogging but has not in the last week Family / Social history:  - Former smoker - Denies alcohol use   O:  Vitals:   10/01/19 1017  BP: 134/90  Pulse: 83   Home BP readings: not checking   Last 3 Office BP readings: BP Readings from Last 3 Encounters:  10/01/19 134/90  09/24/19 (!) 150/120  02/05/19 131/89    BMET    Component Value Date/Time   NA 137 09/24/2019 0943   K 4.4 09/24/2019 0943   CL 100 09/24/2019 0943   CO2 24 09/24/2019 0943   GLUCOSE 237 (H) 09/24/2019 0943   GLUCOSE 250 (H) 04/21/2017 0356   BUN 15 09/24/2019 0943   CREATININE 1.15 09/24/2019 0943   CALCIUM 10.0 09/24/2019 0943   GFRNONAA 75 09/24/2019 0943   GFRAA 87 09/24/2019 0943    Renal function: CrCl cannot be calculated (Unknown ideal weight.).  Clinical ASCVD: No  The 10-year ASCVD risk score Mikey Bussing DC Jr., et al., 2013) is: 14.7%   Values used to calculate the score:     Age: 48 years     Sex: Male     Is Non-Hispanic African American: Yes     Diabetic: Yes     Tobacco smoker: No     Systolic Blood Pressure: 226 mmHg     Is BP treated: Yes     HDL Cholesterol: 54 mg/dL     Total Cholesterol: 196 mg/dL   A/P: Hypertension longstanding currently above goal but improved on current medications. BP Goal = <130/80 mmHg. Patient is not fully adherent but is working to increase this. He also plans to increase exercise level. Will make plans to re-check in ~1 month.   -Continued  current regimen.  -Counseled on lifestyle modifications for blood pressure control including reduced dietary sodium, increased exercise, adequate sleep  Results reviewed and written information provided.   Total time in face-to-face counseling 15 minutes.   F/U Clinic Visit in 1 month.  Benard Halsted, PharmD, [place] [phone]

## 2019-10-01 NOTE — Progress Notes (Signed)
Switch from Levemir to [facility] for cost. Per auto-sub.

## 2019-10-01 NOTE — Patient Instructions (Signed)
Thank you for coming to see us today.   Blood pressure today is improving.  Continue taking blood pressure medications as prescribed.   Limiting salt and caffeine, as well as exercising as able for at least 30 minutes for 5 days out of the week, can also help you lower your blood pressure.  Take your blood pressure at home if you are able. Please write down these numbers and bring them to your visits.  If you have any questions about medications, please call me [phone].  Luke  

## 2019-10-22 ENCOUNTER — Ambulatory Visit: Payer: Medicaid Other | Admitting: Pharmacist

## 2019-11-29 ENCOUNTER — Telehealth: Payer: Self-pay | Admitting: Internal Medicine

## 2019-11-29 ENCOUNTER — Ambulatory Visit: Payer: Medicaid Other | Admitting: Internal Medicine

## 2019-11-29 NOTE — Telephone Encounter (Signed)
A patient family member called stating that patient has COVID and will not be able to keep his appt. Family member was informed that patient appt was over the phone. Patient has trouble talking because of his cough and will try to keep his appt. Patient is also having some difficulty breathing and would like to be prescribed an Rx because he does not want to go to the ED. Patient is also requesting a refill on Doxycycline and mupirocin ointment (BACTROBAN) 2 %

## 2019-11-30 ENCOUNTER — Encounter: Payer: Self-pay | Admitting: Internal Medicine

## 2019-11-30 ENCOUNTER — Ambulatory Visit (INDEPENDENT_AMBULATORY_CARE_PROVIDER_SITE_OTHER): Payer: Self-pay | Admitting: Internal Medicine

## 2019-11-30 DIAGNOSIS — U071 COVID-19: Secondary | ICD-10-CM

## 2019-11-30 MED ORDER — ALBUTEROL SULFATE HFA 108 (90 BASE) MCG/ACT IN AERS: 2.0000 | INHALATION_SPRAY | Freq: Four times a day (QID) | RESPIRATORY_TRACT | 0 refills | Status: DC | PRN

## 2019-11-30 MED ORDER — BENZONATATE 100 MG PO CAPS: 100.0000 mg | ORAL_CAPSULE | Freq: Three times a day (TID) | ORAL | 0 refills | Status: DC | PRN

## 2019-11-30 NOTE — Progress Notes (Signed)
Virtual Visit via Telephone Note Due to current restrictions/limitations of in-office visits due to the COVID-19 pandemic, this scheduled clinical appointment was converted to a telehealth visit  I connected with Albert Miller on 11/30/19 at 11:57 a.m by telephone and verified that I am speaking with the correct person using two identifiers. I am in my office.  The patient is at home.  Only the patient and myself participated in this encounter. I am in my office.  The patient is at home.  Only the patient and myself participated in this encounter.  I discussed the limitations, risks, security and privacy concerns of performing an evaluation and management service by telephone and the availability of in person appointments. I also discussed with the patient that there may be a patient responsible charge related to this service. The patient expressed understanding and agreed to proceed.   History of Present Illness: Pt with hx of DM, HTN and HL. This is UC appt.  Pt c/o not feeling well x 1 wk.  Symptoms include subjective fever, dry cough, slight SOB, little chest and nasal congestion.  Endorses loss of taste and smell but this is getting better. Took COVID test that came back positive 3 days ago -Taking Mucinex DM and cough drops.   Pt lives alone. He thinks he got it through going to the convenience store.  Pt does not work.      Observations/Objective: No direct observation done as this was a telephone encounter  Assessment and Plan: 1. COVID-19 virus infection -Discussed CDC guidelines for home quarantine for at least 10 to 14 days.  Advised patient to be seen in the emergency room if he has any worsening shortness of breath or increasing fever.  Recommend symptomatic treatment at this time with Tylenol as needed for fever, albuterol inhaler and Tessalon Perles to help with the shortness of breath and cough. -Advised to avoid having friends or family come over to his house but if  they do have to come they should be advised that he is Covid positive and should wear mask and gloves. -Okay to break quarantine after it has been at least 10 days since symptoms started, respiratory symptoms have significantly improved and he is not having to take antipyretic medication. - MyChart COVID-19 home monitoring program; Future - Temperature monitoring; Future   Follow Up Instructions: 6 wks for chronic ds management   I discussed the assessment and treatment plan with the patient. The patient was provided an opportunity to ask questions and all were answered. The patient agreed with the plan and demonstrated an understanding of the instructions.   The patient was advised to call back or seek an in-person evaluation if the symptoms worsen or if the condition fails to improve as anticipated.  I provided 11 minutes of non-face-to-face time during this encounter.   Karle Plumber, MD

## 2019-11-30 NOTE — Patient Instructions (Signed)
Person Under Monitoring Name: Albert Miller  Location: [address] [address] [place] [address]   Infection Prevention Recommendations for Individuals Confirmed to have, or Being Evaluated for, 2019 Novel Coronavirus (COVID-19) Infection Who Receive Care at Home  Individuals who are confirmed to have, or are being evaluated for, COVID-19 should follow the prevention steps below until a healthcare provider or local or state health department says they can return to normal activities.  Stay home except to get medical care You should restrict activities outside your home, except for getting medical care. Do not go to work, school, or public areas, and do not use public transportation or taxis.  Call ahead before visiting your doctor Before your medical appointment, call the healthcare provider and tell them that you have, or are being evaluated for, COVID-19 infection. This will help the healthcare provider's office take steps to keep other people from getting infected. Ask your healthcare provider to call the local or state health department.  Monitor your symptoms Seek prompt medical attention if your illness is worsening (e.g., difficulty breathing). Before going to your medical appointment, call the healthcare provider and tell them that you have, or are being evaluated for, COVID-19 infection. Ask your healthcare provider to call the local or state health department.  Wear a facemask You should wear a facemask that covers your nose and mouth when you are in the same room with other people and when you visit a healthcare provider. People who live with or visit you should also wear a facemask while they are in the same room with you.  Separate yourself from other people in your home As much as possible, you should stay in a different room from other people in your home. Also, you should use a separate bathroom, if available.  Avoid sharing household items You should not share  dishes, drinking glasses, cups, eating utensils, towels, bedding, or other items with other people in your home. After using these items, you should wash them thoroughly with soap and water.  Cover your coughs and sneezes Cover your mouth and nose with a tissue when you cough or sneeze, or you can cough or sneeze into your sleeve. Throw used tissues in a lined trash can, and immediately wash your hands with soap and water for at least 20 seconds or use an alcohol-based hand rub.  Wash your [facility] your hands often and thoroughly with soap and water for at least 20 seconds. You can use an alcohol-based hand sanitizer if soap and water are not available and if your hands are not visibly dirty. Avoid touching your eyes, nose, and mouth with unwashed hands.   Prevention Steps for Caregivers and Household Members of Individuals Confirmed to have, or Being Evaluated for, COVID-19 Infection Being Cared for in the Home  If you live with, or provide care at home for, a person confirmed to have, or being evaluated for, COVID-19 infection please follow these guidelines to prevent infection:  Follow healthcare provider's instructions Make sure that you understand and can help the patient follow any healthcare provider instructions for all care.  Provide for the patient's basic needs You should help the patient with basic needs in the home and provide support for getting groceries, prescriptions, and other personal needs.  Monitor the patient's symptoms If they are getting sicker, call his or her medical provider and tell them that the patient has, or is being evaluated for, COVID-19 infection. This will help the healthcare provider's office take  steps to keep other people from getting infected. Ask the healthcare provider to call the local or state health department.  Limit the number of people who have contact with the patient  If possible, have only one caregiver for the patient.  Other  household members should stay in another home or place of residence. If this is not possible, they should stay  in another room, or be separated from the patient as much as possible. Use a separate bathroom, if available.  Restrict visitors who do not have an essential need to be in the home.  Keep older adults, very young children, and other sick people away from the patient Keep older adults, very young children, and those who have compromised immune systems or chronic health conditions away from the patient. This includes people with chronic heart, lung, or kidney conditions, diabetes, and cancer.  Ensure good ventilation Make sure that shared spaces in the home have good air flow, such as from an air conditioner or an opened window, weather permitting.  Wash your hands often  Wash your hands often and thoroughly with soap and water for at least 20 seconds. You can use an alcohol based hand sanitizer if soap and water are not available and if your hands are not visibly dirty.  Avoid touching your eyes, nose, and mouth with unwashed hands.  Use disposable paper towels to dry your hands. If not available, use dedicated cloth towels and replace them when they become wet.  Wear a facemask and gloves  Wear a disposable facemask at all times in the room and gloves when you touch or have contact with the patient's blood, body fluids, and/or secretions or excretions, such as sweat, saliva, sputum, nasal mucus, vomit, urine, or feces.  Ensure the mask fits over your nose and mouth tightly, and do not touch it during use.  Throw out disposable facemasks and gloves after using them. Do not reuse.  Wash your hands immediately after removing your facemask and gloves.  If your personal clothing becomes contaminated, carefully remove clothing and launder. Wash your hands after handling contaminated clothing.  Place all used disposable facemasks, gloves, and other waste in a lined container before  disposing them with other household waste.  Remove gloves and wash your hands immediately after handling these items.  Do not share dishes, glasses, or other household items with the patient  Avoid sharing household items. You should not share dishes, drinking glasses, cups, eating utensils, towels, bedding, or other items with a patient who is confirmed to have, or being evaluated for, COVID-19 infection.  After the person uses these items, you should wash them thoroughly with soap and water.  Wash laundry thoroughly  Immediately remove and wash clothes or bedding that have blood, body fluids, and/or secretions or excretions, such as sweat, saliva, sputum, nasal mucus, vomit, urine, or feces, on them.  Wear gloves when handling laundry from the patient.  Read and follow directions on labels of laundry or clothing items and detergent. In general, wash and dry with the warmest temperatures recommended on the label.  Clean all areas the individual has used often  Clean all touchable surfaces, such as counters, tabletops, doorknobs, bathroom fixtures, toilets, phones, keyboards, tablets, and bedside tables, every day. Also, clean any surfaces that may have blood, body fluids, and/or secretions or excretions on them.  Wear gloves when cleaning surfaces the patient has come in contact with.  Use a diluted bleach solution (e.g., dilute bleach with 1 part bleach  and 10 parts water) or a household disinfectant with a label that says EPA-registered for coronaviruses. To make a bleach solution at home, add 1 tablespoon of bleach to 1 quart (4 cups) of water. For a larger supply, add  cup of bleach to 1 gallon (16 cups) of water.  Read labels of cleaning products and follow recommendations provided on product labels. Labels contain instructions for safe and effective use of the cleaning product including precautions you should take when applying the product, such as wearing gloves or eye protection  and making sure you have good ventilation during use of the product.  Remove gloves and wash hands immediately after cleaning.  Monitor yourself for signs and symptoms of illness Caregivers and household members are considered close contacts, should monitor their health, and will be asked to limit movement outside of the home to the extent possible. Follow the monitoring steps for close contacts listed on the symptom monitoring form.   ? If you have additional questions, contact your local health department or call the epidemiologist on call at [phone] (available 24/7). ? This guidance is subject to change. For the most up-to-date guidance from [facility], please refer to their website: [email or web address]

## 2019-11-30 NOTE — Telephone Encounter (Signed)
Contacted pt on 12/28 and informed pt that he will need to keep appointment just so provider can follow up with him and his symptoms. I made pt aware that if his breathing gets worse he will need to go to the ED. Pt is requesting ointment to be filled. I made pt aware that when Dr. Wynetta Emery does his televisit she will be able to refill what he is needing. Pt states he doesn't have any questions or concerns

## 2019-12-01 ENCOUNTER — Ambulatory Visit: Payer: Medicaid Other | Admitting: Internal Medicine

## 2019-12-16 ENCOUNTER — Other Ambulatory Visit: Payer: Self-pay | Admitting: Internal Medicine

## 2020-01-13 ENCOUNTER — Ambulatory Visit: Payer: Medicaid Other | Admitting: Internal Medicine

## 2020-01-19 ENCOUNTER — Emergency Department (HOSPITAL_BASED_OUTPATIENT_CLINIC_OR_DEPARTMENT_OTHER): Payer: No Typology Code available for payment source

## 2020-01-19 ENCOUNTER — Encounter (HOSPITAL_BASED_OUTPATIENT_CLINIC_OR_DEPARTMENT_OTHER): Payer: Self-pay | Admitting: Emergency Medicine

## 2020-01-19 ENCOUNTER — Other Ambulatory Visit: Payer: Self-pay

## 2020-01-19 ENCOUNTER — Emergency Department (HOSPITAL_BASED_OUTPATIENT_CLINIC_OR_DEPARTMENT_OTHER)
Admission: EM | Admit: 2020-01-19 | Discharge: 2020-01-19 | Disposition: A | Discharge status: Home / Self Care | Payer: No Typology Code available for payment source | Attending: Emergency Medicine | Admitting: Emergency Medicine

## 2020-01-19 DIAGNOSIS — S22000A Wedge compression fracture of unspecified thoracic vertebra, initial encounter for closed fracture: Secondary | ICD-10-CM | POA: Diagnosis not present

## 2020-01-19 DIAGNOSIS — Y939 Activity, unspecified: Secondary | ICD-10-CM | POA: Diagnosis not present

## 2020-01-19 DIAGNOSIS — I10 Essential (primary) hypertension: Secondary | ICD-10-CM | POA: Diagnosis not present

## 2020-01-19 DIAGNOSIS — Z87891 Personal history of nicotine dependence: Secondary | ICD-10-CM | POA: Insufficient documentation

## 2020-01-19 DIAGNOSIS — Y929 Unspecified place or not applicable: Secondary | ICD-10-CM | POA: Insufficient documentation

## 2020-01-19 DIAGNOSIS — Y999 Unspecified external cause status: Secondary | ICD-10-CM | POA: Diagnosis not present

## 2020-01-19 DIAGNOSIS — S299XXA Unspecified injury of thorax, initial encounter: Secondary | ICD-10-CM | POA: Diagnosis present

## 2020-01-19 DIAGNOSIS — Z794 Long term (current) use of insulin: Secondary | ICD-10-CM | POA: Insufficient documentation

## 2020-01-19 DIAGNOSIS — E119 Type 2 diabetes mellitus without complications: Secondary | ICD-10-CM | POA: Diagnosis not present

## 2020-01-19 DIAGNOSIS — Z7982 Long term (current) use of aspirin: Secondary | ICD-10-CM | POA: Diagnosis not present

## 2020-01-19 HISTORY — DX: Type 2 diabetes mellitus without complications: E11.9

## 2020-01-19 MED ORDER — LIDOCAINE 5 % EX PTCH: 1.0000 | MEDICATED_PATCH | CUTANEOUS | 0 refills | Status: AC

## 2020-01-19 MED ORDER — NAPROXEN 250 MG PO TABS
500.0000 mg | ORAL_TABLET | Freq: Once | ORAL | Status: AC
  Administered 2020-01-19: 500 mg via ORAL
  Filled 2020-01-19: qty 2

## 2020-01-19 MED FILL — LIDOCAINE 5 % PTCH: 5 | 30 days supply | Qty: 30 | Fill #0
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
## 2020-01-19 NOTE — ED Notes (Signed)
Pt to xray
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
## 2020-01-19 NOTE — ED Notes (Signed)
Pt to xray dept.

## 2020-01-28 ENCOUNTER — Ambulatory Visit: Payer: Self-pay | Attending: Internal Medicine | Admitting: Internal Medicine

## 2020-01-28 ENCOUNTER — Other Ambulatory Visit: Payer: Self-pay | Admitting: Internal Medicine

## 2020-01-28 ENCOUNTER — Other Ambulatory Visit: Payer: Self-pay

## 2020-01-28 ENCOUNTER — Encounter: Payer: Self-pay | Admitting: Internal Medicine

## 2020-01-28 VITALS — BP 157/103 | HR 80 | Temp 98.4°F | Resp 16 | Wt 289.2 lb

## 2020-01-28 DIAGNOSIS — M549 Dorsalgia, unspecified: Secondary | ICD-10-CM

## 2020-01-28 DIAGNOSIS — E119 Type 2 diabetes mellitus without complications: Secondary | ICD-10-CM

## 2020-01-28 DIAGNOSIS — M5489 Other dorsalgia: Secondary | ICD-10-CM

## 2020-01-28 DIAGNOSIS — E1100 Type 2 diabetes mellitus with hyperosmolarity without nonketotic hyperglycemic-hyperosmolar coma (NKHHC): Secondary | ICD-10-CM

## 2020-01-28 DIAGNOSIS — I10 Essential (primary) hypertension: Secondary | ICD-10-CM

## 2020-01-28 DIAGNOSIS — E669 Obesity, unspecified: Secondary | ICD-10-CM

## 2020-01-28 DIAGNOSIS — Z794 Long term (current) use of insulin: Secondary | ICD-10-CM

## 2020-01-28 DIAGNOSIS — M5431 Sciatica, right side: Secondary | ICD-10-CM

## 2020-01-28 DIAGNOSIS — E1169 Type 2 diabetes mellitus with other specified complication: Secondary | ICD-10-CM

## 2020-01-28 DIAGNOSIS — E785 Hyperlipidemia, unspecified: Secondary | ICD-10-CM

## 2020-01-28 LAB — POCT GLYCOSYLATED HEMOGLOBIN (HGB A1C): HbA1c, POC (controlled diabetic range): 10.8 % — AB (ref 0.0–7.0)

## 2020-01-28 LAB — GLUCOSE, POCT (MANUAL RESULT ENTRY): POC Glucose: 367 mg/dl — AB (ref 70–99)

## 2020-01-28 MED ORDER — LISINOPRIL-HYDROCHLOROTHIAZIDE 20-25 MG PO TABS: 1.0000 | ORAL_TABLET | Freq: Every day | ORAL | 1 refills | Status: AC

## 2020-01-28 MED ORDER — METFORMIN HCL 1000 MG PO TABS: 1000.0000 mg | ORAL_TABLET | Freq: Two times a day (BID) | ORAL | 3 refills | Status: AC

## 2020-01-28 MED ORDER — AMLODIPINE BESYLATE 5 MG PO TABS: 5.0000 mg | ORAL_TABLET | Freq: Every day | ORAL | 1 refills | Status: DC

## 2020-01-28 MED ORDER — ATORVASTATIN CALCIUM 20 MG PO TABS: 20.0000 mg | ORAL_TABLET | Freq: Every day | ORAL | 1 refills | Status: DC

## 2020-01-28 MED ORDER — NAPROXEN 500 MG PO TABS: 500.0000 mg | ORAL_TABLET | Freq: Two times a day (BID) | ORAL | 0 refills | Status: DC | PRN

## 2020-01-28 MED FILL — metFORMIN HCL 1000 MG TABS: 1000 | 30 days supply | Qty: 60 | Fill #1

## 2020-01-28 MED FILL — AMLODIPINE BESYLATE 5 MG TA: 5 | 30 days supply | Qty: 30 | Fill #1

## 2020-01-28 MED FILL — ATORVASTATIN CALCIUM 20 MG: 20 | 30 days supply | Qty: 30 | Fill #1

## 2020-01-28 MED FILL — NAPROXEN 500 MG TABLET: 500 | 10 days supply | Qty: 20 | Fill #0

## 2020-01-28 MED FILL — LISINOPRIL-HCTZ 20-25 MG TA: 20-25 | 30 days supply | Qty: 30 | Fill #1

## 2020-01-28 NOTE — Patient Instructions (Signed)
Take the Naprosyn as needed for pain. Use a heating pad as needed.
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
## 2020-01-31 ENCOUNTER — Ambulatory Visit: Payer: Medicaid Other | Admitting: Internal Medicine

## 2020-03-28 ENCOUNTER — Ambulatory Visit: Payer: Self-pay | Attending: Internal Medicine | Admitting: Internal Medicine

## 2020-03-28 ENCOUNTER — Other Ambulatory Visit: Payer: Self-pay

## 2020-04-04 ENCOUNTER — Encounter: Payer: Self-pay | Admitting: Internal Medicine

## 2020-04-04 ENCOUNTER — Other Ambulatory Visit: Payer: Self-pay

## 2020-04-04 ENCOUNTER — Ambulatory Visit: Payer: Self-pay | Attending: Internal Medicine | Admitting: Internal Medicine

## 2020-04-04 VITALS — BP 144/99 | HR 80 | Temp 96.8°F | Resp 16 | Wt 276.2 lb

## 2020-04-04 DIAGNOSIS — Z8616 Personal history of COVID-19: Secondary | ICD-10-CM | POA: Insufficient documentation

## 2020-04-04 DIAGNOSIS — Z79899 Other long term (current) drug therapy: Secondary | ICD-10-CM | POA: Insufficient documentation

## 2020-04-04 DIAGNOSIS — E119 Type 2 diabetes mellitus without complications: Secondary | ICD-10-CM | POA: Insufficient documentation

## 2020-04-04 DIAGNOSIS — E785 Hyperlipidemia, unspecified: Secondary | ICD-10-CM | POA: Insufficient documentation

## 2020-04-04 DIAGNOSIS — I10 Essential (primary) hypertension: Secondary | ICD-10-CM | POA: Insufficient documentation

## 2020-04-04 DIAGNOSIS — M5431 Sciatica, right side: Secondary | ICD-10-CM | POA: Insufficient documentation

## 2020-04-04 DIAGNOSIS — Z87891 Personal history of nicotine dependence: Secondary | ICD-10-CM | POA: Insufficient documentation

## 2020-04-04 DIAGNOSIS — G8911 Acute pain due to trauma: Secondary | ICD-10-CM | POA: Insufficient documentation

## 2020-04-04 DIAGNOSIS — Z7982 Long term (current) use of aspirin: Secondary | ICD-10-CM | POA: Insufficient documentation

## 2020-04-04 DIAGNOSIS — Z791 Long term (current) use of non-steroidal anti-inflammatories (NSAID): Secondary | ICD-10-CM | POA: Insufficient documentation

## 2020-04-04 DIAGNOSIS — Z794 Long term (current) use of insulin: Secondary | ICD-10-CM | POA: Insufficient documentation

## 2020-04-04 MED ORDER — AMLODIPINE BESYLATE 10 MG PO TABS: 10.0000 mg | ORAL_TABLET | Freq: Every day | ORAL | 1 refills | Status: DC

## 2020-04-04 MED FILL — AMLODIPINE BESYLATE 10 MG T: 10 | 30 days supply | Qty: 30 | Fill #0
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
## 2020-04-18 ENCOUNTER — Ambulatory Visit: Payer: Self-pay | Admitting: Orthopaedic Surgery

## 2020-04-18 ENCOUNTER — Other Ambulatory Visit: Payer: Self-pay

## 2020-04-18 ENCOUNTER — Encounter: Payer: Self-pay | Admitting: Orthopaedic Surgery

## 2020-04-18 DIAGNOSIS — M545 Low back pain, unspecified: Secondary | ICD-10-CM | POA: Insufficient documentation
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
## 2020-06-16 ENCOUNTER — Telehealth: Payer: Self-pay | Admitting: Internal Medicine

## 2020-06-16 NOTE — Telephone Encounter (Signed)
[employer] is calling in to follow up on medical records request sent in April. They are stating that they received the bill for receiving the records but still has not received them.   DOS requesting: 01/28/20 - 04/04/20.     Fax: [phone]  AttnDahlia Client    Phone: [phone], Dahlia Client

## 2020-06-16 NOTE — Telephone Encounter (Signed)
Albert Miller is asking for the records to be refax please

## 2020-06-26 ENCOUNTER — Other Ambulatory Visit: Payer: Self-pay | Admitting: Internal Medicine

## 2020-06-26 ENCOUNTER — Other Ambulatory Visit: Payer: Self-pay | Admitting: Primary Care

## 2020-06-26 MED FILL — metFORMIN HCL 1000 MG TABS: 1000 | 30 days supply | Qty: 60 | Fill #2

## 2020-06-26 NOTE — Telephone Encounter (Signed)
Onset 2 weeks ago, of "bumps' on side and top of head. Some a filled with pus. Pt stated get is prone wo this breakout when weather is warm.  Pt has been applying warm wet compresses and 2 bumps popped. No redness but he does note swelling at the bum sites.  Denies fever, or other rashes on body.  Pt has not seen dermatologist as recommended in OV note. Pt stated he is leaving he state today and will be not be back in town until Thursday.  Routing to office for review.

## 2020-07-18 ENCOUNTER — Ambulatory Visit: Payer: Medicaid Other | Admitting: Orthopaedic Surgery

## 2020-07-31 ENCOUNTER — Encounter: Payer: Self-pay | Admitting: Internal Medicine

## 2020-08-23 MED FILL — metFORMIN HCL 1000 MG TABS: 1000 | 30 days supply | Qty: 60 | Fill #3

## 2020-08-23 MED FILL — LISINOPRIL-HYDROCHLOROTHIAZ: 20-25 | 30 days supply | Qty: 30 | Fill #0

## 2020-09-25 ENCOUNTER — Telehealth: Payer: Self-pay | Admitting: Internal Medicine

## 2020-09-25 NOTE — Telephone Encounter (Signed)
Copied from CRM [id number]. Topic: General - Other >> Sep 25, 2020  1:26 PM Herby Abraham C wrote: Reason for CRM: pt called in to request a call back from [employer] to discuss financial assistance.    CB:  [phone]

## 2020-09-26 NOTE — Telephone Encounter (Signed)
I call back the Pt, Albert Miller inform that he need to call [phone] to schedule a financial appt

## 2020-09-28 MED FILL — LISINOPRIL-HYDROCHLOROTHIAZ: 20-25 | 30 days supply | Qty: 30 | Fill #1

## 2020-09-28 MED FILL — METFORMIN HCL 1000 MG TABS: 1000 | 30 days supply | Qty: 60 | Fill #0

## 2020-12-06 MED FILL — LISINOPRIL-HYDROCHLOROTHIAZ: 20-25 | 30 days supply | Qty: 30 | Fill #2

## 2020-12-06 MED FILL — METFORMIN HCL 1000 MG TABS: 1000 | 30 days supply | Qty: 60 | Fill #1

## 2021-04-05 ENCOUNTER — Ambulatory Visit: Payer: Medicaid Other | Admitting: Physician Assistant

## 2021-04-05 DIAGNOSIS — E1165 Type 2 diabetes mellitus with hyperglycemia: Secondary | ICD-10-CM

## 2021-08-17 ENCOUNTER — Other Ambulatory Visit: Payer: Self-pay
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
## 2021-10-01 ENCOUNTER — Ambulatory Visit (INDEPENDENT_AMBULATORY_CARE_PROVIDER_SITE_OTHER): Payer: 59 | Admitting: Podiatry

## 2021-10-01 ENCOUNTER — Other Ambulatory Visit: Payer: Self-pay

## 2021-10-01 ENCOUNTER — Encounter: Payer: Self-pay | Admitting: Podiatry

## 2021-10-01 DIAGNOSIS — L600 Ingrowing nail: Secondary | ICD-10-CM

## 2021-10-01 DIAGNOSIS — B351 Tinea unguium: Secondary | ICD-10-CM

## 2021-10-01 NOTE — Progress Notes (Signed)
Subjective:   Patient ID: Albert Miller, male   DOB: 50 y.o.   MRN: [id number]   HPI Patient presents with thick dystrophic big toenails of both feet that he states gets sore and he like to have them removed permanently.  States he is doing better with his sugar but A1c is still over 10 but it is coming down pretty rapidly.  Patient does not smoke likes to be active has moderate obesity   Review of Systems  All other systems reviewed and are negative.      Objective:  Physical Exam Vitals and nursing note reviewed.  Constitutional:      Appearance: He is well-developed.  Pulmonary:     Effort: Pulmonary effort is normal.  Musculoskeletal:        General: Normal range of motion.  Skin:    General: Skin is warm.  Neurological:     Mental Status: He is alert.    Neurovascular status was found to be intact muscle strength adequate range of motion subtalar midtarsal joint within normal limits.  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  He wants to do this and is motivated to get his A1c down I spent a great deal time going over with him the surgical procedures that would be necessary to remove these nails permanently and the goals that I have for him.  He will be seen back as his A1c gets below 9 and closer to 8 for permanent procedures

## 2022-12-19 ENCOUNTER — Telehealth: Payer: Self-pay | Admitting: Pharmacist

## 2022-12-19 NOTE — Telephone Encounter (Signed)
Called patient to schedule an appointment as patient has managed medicaid with an A1c > 9%. I was unable to reach the patient so I left a HIPAA-compliant message requesting that the patient return my call.   Benard Halsted, PharmD, [place], [place] [phone]

## 2023-08-18 DIAGNOSIS — M79672 Pain in left foot: Secondary | ICD-10-CM | POA: Diagnosis not present

## 2023-08-18 DIAGNOSIS — Z6833 Body mass index (BMI) 33.0-33.9, adult: Secondary | ICD-10-CM | POA: Diagnosis not present

## 2023-08-18 DIAGNOSIS — M79671 Pain in right foot: Secondary | ICD-10-CM | POA: Diagnosis not present

## 2023-08-19 ENCOUNTER — Other Ambulatory Visit: Payer: Self-pay

## 2023-08-19 ENCOUNTER — Emergency Department (HOSPITAL_BASED_OUTPATIENT_CLINIC_OR_DEPARTMENT_OTHER)
Admission: EM | Admit: 2023-08-19 | Discharge: 2023-08-19 | Disposition: A | Discharge status: Home / Self Care | Payer: 59 | Attending: Emergency Medicine | Admitting: Emergency Medicine

## 2023-08-19 ENCOUNTER — Encounter (HOSPITAL_BASED_OUTPATIENT_CLINIC_OR_DEPARTMENT_OTHER): Payer: Self-pay | Admitting: Emergency Medicine

## 2023-08-19 ENCOUNTER — Emergency Department (HOSPITAL_BASED_OUTPATIENT_CLINIC_OR_DEPARTMENT_OTHER): Payer: 59

## 2023-08-19 DIAGNOSIS — I82811 Embolism and thrombosis of superficial veins of right lower extremities: Secondary | ICD-10-CM | POA: Diagnosis not present

## 2023-08-19 DIAGNOSIS — E1165 Type 2 diabetes mellitus with hyperglycemia: Secondary | ICD-10-CM | POA: Diagnosis not present

## 2023-08-19 DIAGNOSIS — Z794 Long term (current) use of insulin: Secondary | ICD-10-CM | POA: Insufficient documentation

## 2023-08-19 DIAGNOSIS — I80251 Phlebitis and thrombophlebitis of right calf muscular vein: Secondary | ICD-10-CM | POA: Diagnosis not present

## 2023-08-19 DIAGNOSIS — Z7984 Long term (current) use of oral hypoglycemic drugs: Secondary | ICD-10-CM | POA: Diagnosis not present

## 2023-08-19 DIAGNOSIS — M79604 Pain in right leg: Secondary | ICD-10-CM | POA: Diagnosis not present

## 2023-08-19 DIAGNOSIS — M79671 Pain in right foot: Secondary | ICD-10-CM | POA: Diagnosis not present

## 2023-08-19 LAB — COMPREHENSIVE METABOLIC PANEL
ALT: 23 U/L (ref 0–44)
AST: 13 U/L — ABNORMAL LOW (ref 15–41)
Albumin: 4 g/dL (ref 3.5–5.0)
Alkaline Phosphatase: 87 U/L (ref 38–126)
Anion gap: 7 (ref 5–15)
BUN: 11 mg/dL (ref 6–20)
CO2: 28 mmol/L (ref 22–32)
Calcium: 9 mg/dL (ref 8.9–10.3)
Chloride: 100 mmol/L (ref 98–111)
Creatinine, Ser: 0.91 mg/dL (ref 0.61–1.24)
GFR, Estimated: >60 mL/min (ref >60)
Glucose, Bld: 304 mg/dL — ABNORMAL HIGH (ref 70–99)
Potassium: 3.9 mmol/L (ref 3.5–5.1)
Sodium: 135 mmol/L (ref 135–145)
Total Bilirubin: 0.6 mg/dL (ref 0.3–1.2)
Total Protein: 7.5 g/dL (ref 6.5–8.1)

## 2023-08-19 LAB — CBC WITH DIFFERENTIAL/PLATELET
Abs Immature Granulocytes: 0.02 10*3/uL (ref 0.00–0.07)
Basophils Absolute: 0 10*3/uL (ref 0.0–0.1)
Basophils Relative: 0 %
Eosinophils Absolute: 0.1 10*3/uL (ref 0.0–0.5)
Eosinophils Relative: 1 %
HCT: 41.3 % (ref 39.0–52.0)
Hemoglobin: 14.6 g/dL (ref 13.0–17.0)
Immature Granulocytes: 0 %
Lymphocytes Relative: 43 %
Lymphs Abs: 2.2 10*3/uL (ref 0.7–4.0)
MCH: 30.8 pg (ref 26.0–34.0)
MCHC: 35.4 g/dL (ref 30.0–36.0)
MCV: 87.1 fL (ref 80.0–100.0)
Monocytes Absolute: 0.5 10*3/uL (ref 0.1–1.0)
Monocytes Relative: 10 %
Neutro Abs: 2.3 10*3/uL (ref 1.7–7.7)
Neutrophils Relative %: 46 %
Platelets: 263 10*3/uL (ref 150–400)
RBC: 4.74 MIL/uL (ref 4.22–5.81)
RDW: 11.9 % (ref 11.5–15.5)
WBC: 5.2 10*3/uL (ref 4.0–10.5)
nRBC: 0 % (ref 0.0–0.2)

## 2023-08-19 LAB — MAGNESIUM: Magnesium: 1.7 mg/dL (ref 1.7–2.4)

## 2023-08-19 NOTE — ED Notes (Signed)
Pt discharged to home using teachback Method. Discharge instructions have been discussed with patient and/or family members. Pt verbally acknowledges understanding d/c instructions, has been given opportunity for questions to be answered, and endorses comprehension to checkout at registration before leaving.

## 2023-08-19 NOTE — ED Notes (Signed)
EDP Victory Dakin, [place] aware of pts b/p

## 2023-08-19 NOTE — Discharge Instructions (Addendum)
You were seen in the ER today for evaluation of your toe pain. This is likely neuropathy for your diabetes, or from some of your back pain. I would like for you to follow up with your PCP for re-evaluation and further management of this. Your lab work showed that your sugar is elevated, please make sure you are compliant with your medications. Your ultrasound showed that you have a small, superficial blood clot in your right calf. For this, I recommend taking a daily 81mg  aspirin and apply warm compresses to the area.  Please make sure you follow-up with your primary care doctor within 1 to 2 weeks for follow-up and reevaluation of this.  If you start to notice significant leg swelling, fever, chest pain, shortness of breath, temperature or color changes of the leg or foot, please return to your nearest emerged part for evaluation.  If you have any concerns of any worsening symptoms, please return to your nearest emergency department for reevaluation.  Contact a health care provider if: You miss a dose of your blood thinner, if applicable. Your symptoms do not improve. You have unusual bruising. You have nausea, vomiting, or diarrhea that lasts for more than a day. Get help right away if: You are breathing fast or have chest pain. You have blood in your vomit, urine, or stool. You have severe pain in your affected arm or leg or new pain in any arm or leg. You have light-headedness, dizziness, a severe headache, or confusion. These symptoms may represent a serious problem that is an emergency. Do not wait to see if the symptoms will go away. Get medical help right away. Call your local emergency services (911 in the [place] Do not drive yourself to the hospital.

## 2023-08-19 NOTE — ED Triage Notes (Addendum)
Pt arrives pov, steady gait c/o RT foot pain x 1-2 months, LT 3rd toe pain. Reports "feels like nerve pain". 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

## 2023-08-19 NOTE — ED Provider Notes (Signed)
[place] EMERGENCY DEPARTMENT AT [facility] Provider Note   CSN: [id number] Arrival date & time: 08/19/23  1031     History {Add pertinent medical, surgical, social history, OB history to HPI:1} Chief Complaint  Patient presents with  . Foot Pain    Albert Miller is a 52 y.o. male.   Foot Pain      Home Medications Prior to Admission medications   Medication Sig Start Date End Date Taking? Authorizing Provider  albuterol (VENTOLIN HFA) 108 (90 Base) MCG/ACT inhaler TAKE 2 PUFFS BY MOUTH EVERY 6 HOURS AS NEEDED FOR WHEEZE OR SHORTNESS OF BREATH 12/16/19   Marcine Matar, MD  atorvastatin (LIPITOR) 10 MG tablet Take 10 mg by mouth daily. 09/19/21   [provider]  Insulin Glargine (BASAGLAR KWIKPEN) 100 UNIT/ML SOPN Inject 0.1 mLs (10 Units total) into the skin daily. Patient not taking: Reported on 11/30/2019 10/01/19   Marcine Matar, MD  Insulin Pen Needle (B-D UF III MINI PEN NEEDLES) 31G X 5 MM MISC Use as instructed. Inject into the skin once nightly. Patient not taking: Reported on 01/28/2020 02/08/19   Claiborne Rigg, NP  lidocaine (LIDODERM) 5 % Place 1 patch onto the skin daily. Remove & Discard patch within 12 hours or as directed by MD 01/19/20   Nicanor Alcon, April, MD  lisinopril-hydrochlorothiazide (ZESTORETIC) 20-25 MG tablet Take 1 tablet by mouth daily. 01/28/20   Marcine Matar, MD  lisinopril-hydrochlorothiazide (ZESTORETIC) 20-25 MG tablet TAKE 1 TABLET BY MOUTH DAILY. 01/28/20 01/27/21  Marcine Matar, MD  metFORMIN (GLUCOPHAGE) 1000 MG tablet Take 1 tablet (1,000 mg total) by mouth 2 (two) times daily with a meal. 01/28/20   Marcine Matar, MD  metFORMIN (GLUCOPHAGE) 1000 MG tablet TAKE 1 TABLET (1,000 MG TOTAL) BY MOUTH 2 (TWO) TIMES DAILY WITH A MEAL. 01/28/20 01/27/21  Marcine Matar, MD      Allergies    Patient has no known allergies.    Review of Systems   Review of Systems  Physical Exam Updated Vital  Signs BP 137/89   Pulse 74   Temp 98.7 F (37.1 C)   Resp 18   Wt 112.5 kg   SpO2 100%   BMI 33.63 kg/m  Physical Exam  ED Results / Procedures / Treatments   Labs (all labs ordered are listed, but only abnormal results are displayed) Labs Reviewed  COMPREHENSIVE METABOLIC PANEL - Abnormal; Notable for the following components:      Result Value   Glucose, Bld 304 (*)    AST 13 (*)    All other components within normal limits  CBC WITH DIFFERENTIAL/PLATELET  MAGNESIUM    EKG None  Radiology No results found.  Procedures Procedures  {Document cardiac monitor, telemetry assessment procedure when appropriate:1}  Medications Ordered in ED Medications - No data to display  ED Course/ Medical Decision Making/ A&P   {   Click here for ABCD2, HEART and other calculatorsREFRESH Note before signing :1}                              Medical Decision Making Amount and/or Complexity of Data Reviewed Labs: ordered.   ***  {Document critical care time when appropriate:1} {Document review of labs and clinical decision tools ie heart score, Chads2Vasc2 etc:1}  {Document your independent review of radiology images, and any outside records:1} {Document your discussion with family members, caretakers, and with consultants:1} {Document  social determinants of health affecting pt's care:1} {Document your decision making why or why not admission, treatments were needed:1} Final Clinical Impression(s) / ED Diagnoses Final diagnoses:  None    Rx / DC Orders ED Discharge Orders     None

## 2023-10-01 DIAGNOSIS — E119 Type 2 diabetes mellitus without complications: Secondary | ICD-10-CM | POA: Diagnosis not present

## 2023-12-09 DIAGNOSIS — E119 Type 2 diabetes mellitus without complications: Secondary | ICD-10-CM | POA: Diagnosis not present

## 2023-12-09 DIAGNOSIS — E7800 Pure hypercholesterolemia, unspecified: Secondary | ICD-10-CM | POA: Diagnosis not present

## 2023-12-09 DIAGNOSIS — I10 Essential (primary) hypertension: Secondary | ICD-10-CM | POA: Diagnosis not present

## 2023-12-09 DIAGNOSIS — Z23 Encounter for immunization: Secondary | ICD-10-CM | POA: Diagnosis not present

## 2023-12-30 ENCOUNTER — Telehealth: Payer: Self-pay | Admitting: Pharmacist

## 2023-12-30 NOTE — Progress Notes (Signed)
   12/30/2023  Patient ID: Albert Miller, male   DOB: December 17, 1970, 53 y.o.   MRN: [id number]  [employer] Scheduling:   Was able to reach the patient today and schedule appointment for [facility] Phone Visit on 02/02/24 at 9AM as patient would prefer to follow-up 3 weeks after 01/13/24 PCP visit to implement new changes.  Patient has my phone number and aware to reach out if needing to reschedule prior.   Marlowe Aschoff, PharmD [facility] Phone Number: [phone]

## 2024-02-02 ENCOUNTER — Other Ambulatory Visit: Payer: Self-pay | Admitting: Pharmacist

## 2024-02-04 NOTE — Progress Notes (Signed)
   02/04/2024  Patient ID: Albert Miller, male   DOB: 23-May-1971, 53 y.o.   MRN: [id number]  Called and left voicemail on 02/02/24 on Monday. Unable to reach at that time. Left HIPAA compliant voicemail requesting call back at earliest convenience.   Called and left another voicemail to re-schedule DM call on 02/04/24. Still unable to reach at this time. Will await returned call.   *Of note, appears all meds were last picked up on 12/08/23 so will likely be out of them for awhile now (or should be if adherent)  Marlowe Aschoff, PharmD [facility] Phone Number: [phone]

## 2024-02-18 ENCOUNTER — Telehealth: Payer: Self-pay | Admitting: Pharmacist

## 2024-02-18 NOTE — Progress Notes (Addendum)
   02/18/2024  Patient ID: Albert Miller, male   DOB: 01/04/1971, 53 y.o.   MRN: [id number]   Scheduled DM call on 12/30/23 for 02/02/24. Unable to reach for visit on that date. Tried again on 02/04/24 and left a voicemail. Tried for second attempt on 02/18/24. Will try third and last attempt next week.   Patient was identified for DM counseling based on A1c greater than 8% on last lab results.   Tried calling patient today to schedule free DM initial visit me as the pharmacist, with all changes made in collaboration with the PCP. Left HIPAA compliant voicemail requesting call back at earliest convenience to schedule. If patient returns call to office, you can transfer them to my line at [phone]. Thank you!  Attempt #2 (out of 3)  Update from 02/25/24: Called patient and left voicemail requesting call back at earliest convenience to schedule DM visit. Unable to reach. Third and final attempt.    Ricka Burdock, PharmD [facility]  Phone Number: [phone]

## 2024-11-01 ENCOUNTER — Ambulatory Visit: Admitting: Podiatry

## 2024-11-17 DIAGNOSIS — Z125 Encounter for screening for malignant neoplasm of prostate: Secondary | ICD-10-CM | POA: Diagnosis not present

## 2024-11-17 DIAGNOSIS — Z1329 Encounter for screening for other suspected endocrine disorder: Secondary | ICD-10-CM | POA: Diagnosis not present

## 2024-11-17 DIAGNOSIS — I10 Essential (primary) hypertension: Secondary | ICD-10-CM | POA: Diagnosis not present

## 2024-11-17 DIAGNOSIS — Z13220 Encounter for screening for lipoid disorders: Secondary | ICD-10-CM | POA: Diagnosis not present

## 2024-11-17 DIAGNOSIS — Z1211 Encounter for screening for malignant neoplasm of colon: Secondary | ICD-10-CM | POA: Diagnosis not present

## 2024-11-17 DIAGNOSIS — Z0000 Encounter for general adult medical examination without abnormal findings: Secondary | ICD-10-CM | POA: Diagnosis not present

## 2024-11-17 DIAGNOSIS — E119 Type 2 diabetes mellitus without complications: Secondary | ICD-10-CM | POA: Diagnosis not present

## 2024-12-16 NOTE — Progress Notes (Signed)
 Albert Miller                                          MRN: [id number]   12/16/2024   The [facility] Quality Team Specialist reviewed this patient medical record for the purposes of chart review for care gap closure. The following were reviewed: chart review for care gap closure-glycemic status assessment.    [facility] Quality Team
